# Patient Record
Sex: Female | Born: 1940 | Race: White | Hispanic: No | Smoking: Never smoker
Health system: Southern US, Community
[De-identification: ages and names within clinical notes are randomized; demographics above are authoritative.]

## PROBLEM LIST (undated history)

## (undated) DIAGNOSIS — M5136 Other intervertebral disc degeneration, lumbar region: Secondary | ICD-10-CM

## (undated) DIAGNOSIS — M51369 Other intervertebral disc degeneration, lumbar region without mention of lumbar back pain or lower extremity pain: Secondary | ICD-10-CM

## (undated) HISTORY — DX: Other intervertebral disc degeneration, lumbar region without mention of lumbar back pain or lower extremity pain: M51.369

## (undated) HISTORY — DX: Other intervertebral disc degeneration, lumbar region: M51.36

---

## 1966-10-15 HISTORY — PX: APPENDECTOMY: SHX54

## 1977-10-15 HISTORY — PX: KNEE SURGERY: SHX244

## 2001-10-15 LAB — CONVERTED CEMR LAB: Pap Smear: NORMAL

## 2009-06-22 ENCOUNTER — Ambulatory Visit: Payer: Self-pay | Admitting: Family Medicine

## 2009-06-22 ENCOUNTER — Encounter: Payer: Self-pay | Admitting: Family Medicine

## 2009-06-24 LAB — CONVERTED CEMR LAB
BUN: 24 mg/dL — ABNORMAL HIGH (ref 6–23)
CO2: 20 meq/L (ref 19–32)
Calcium: 9.8 mg/dL (ref 8.4–10.5)
Chloride: 106 meq/L (ref 96–112)
Cholesterol: 238 mg/dL — ABNORMAL HIGH (ref 0–200)
Creatinine, Ser: 0.86 mg/dL (ref 0.40–1.20)
Glucose, Bld: 100 mg/dL — ABNORMAL HIGH (ref 70–99)
HDL: 87 mg/dL (ref >39)
Total Bilirubin: 0.6 mg/dL (ref 0.3–1.2)
Total CHOL/HDL Ratio: 2.7
Triglycerides: 90 mg/dL (ref <150)
VLDL: 18 mg/dL (ref 0–40)

## 2009-06-30 ENCOUNTER — Telehealth: Payer: Self-pay | Admitting: Family Medicine

## 2010-08-24 ENCOUNTER — Ambulatory Visit: Payer: Self-pay | Admitting: Family Medicine

## 2010-09-04 ENCOUNTER — Encounter: Payer: Self-pay | Admitting: Family Medicine

## 2010-09-05 ENCOUNTER — Encounter: Admission: RE | Admit: 2010-09-05 | Discharge: 2010-09-05 | Payer: Self-pay | Admitting: Family Medicine

## 2010-09-05 DIAGNOSIS — M810 Age-related osteoporosis without current pathological fracture: Secondary | ICD-10-CM

## 2010-09-05 LAB — CONVERTED CEMR LAB
Alkaline Phosphatase: 61 units/L (ref 39–117)
BUN: 17 mg/dL (ref 6–23)
CO2: 24 meq/L (ref 19–32)
Cholesterol: 229 mg/dL — ABNORMAL HIGH (ref 0–200)
Glucose, Bld: 101 mg/dL — ABNORMAL HIGH (ref 70–99)
HDL: 90 mg/dL (ref >39)
LDL Cholesterol: 120 mg/dL — ABNORMAL HIGH (ref 0–99)
Sodium: 139 meq/L (ref 135–145)
Total Bilirubin: 0.7 mg/dL (ref 0.3–1.2)
Total Protein: 7.1 g/dL (ref 6.0–8.3)
Triglycerides: 97 mg/dL (ref <150)
VLDL: 19 mg/dL (ref 0–40)

## 2010-09-06 ENCOUNTER — Encounter: Payer: Self-pay | Admitting: Family Medicine

## 2010-09-06 LAB — CONVERTED CEMR LAB: OCCULT 3: NEGATIVE

## 2010-09-11 ENCOUNTER — Encounter: Payer: Self-pay | Admitting: Family Medicine

## 2010-09-12 ENCOUNTER — Telehealth: Payer: Self-pay | Admitting: Family Medicine

## 2010-09-12 LAB — CONVERTED CEMR LAB: Vit D, 25-Hydroxy: 32 ng/mL (ref 30–89)

## 2010-11-14 NOTE — Progress Notes (Signed)
Summary: osteoporosis  Phone Note Call from Patient   Caller: Patient Call For: Nani Gasser MD Reason for Call: Privacy/Consent Authorization Summary of Call: pt decided she doesnt want to take medications for her osteoporosis. Pt wants to know if it is ok if she increases her calcium intake instead Initial call taken by: Avon Gully CMA, Duncan Dull),  September 12, 2010 1:23 PM  Follow-up for Phone Call        Recommend total of 1200mg  of calcium a day total. It is up to her if doesn't want to take the bone medicaiton.  Follow-up by: Nani Gasser MD,  September 12, 2010 3:18 PM  Additional Follow-up for Phone Call Additional follow up Details #1::        pt notified

## 2010-11-14 NOTE — Miscellaneous (Signed)
Summary: Stool cards neg x 3  Clinical Lists Changes  Orders: Added new Service order of Hemoccult Cards -3 specimans (take home) (16109) - Signed Observations: Added new observation of HEMOCCULT 3: negative (09/06/2010 8:18) Added new observation of HEMOCCULT 2: negative (09/06/2010 8:18) Added new observation of HEMOCCULT 1: negative (09/06/2010 8:18)     Laboratory Results    Stool - Occult Blood Hemmoccult #1: negative Hemoccult #2: negative Hemoccult #3: negative

## 2010-11-14 NOTE — Assessment & Plan Note (Signed)
Summary: Annual Wellness Exam   Vital Signs:  Patient profile:   70 year old female Height:      65.5 inches Weight:      146 pounds BMI:     24.01 Temp:     97.4 degrees F oral Pulse rate:   66 / minute BP sitting:   142 / 78  (right arm) Cuff size:   regular  Vitals Entered By: Avon Gully CMA, Duncan Dull) (August 24, 2010 2:19 PM) CC: CPE per insurance  Vision Screening:Left eye w/o correction: 20 / 30 Right Eye w/o correction: 20 / 40 Both eyes w/o correction:  20/ 25        Vision Entered By: Avon Gully CMA, Duncan Dull) (August 24, 2010 2:19 PM)   Primary Care Provider:  Nani Gasser, MD  CC:  CPE per insurance.  History of Present Illness: See scanned document.   Current Medications (verified): 1)  None  Allergies (verified): No Known Drug Allergies  Comments:  Nurse/Medical Assistant: The patient's medications and allergies were reviewed with the patient and were updated in the Medication and Allergy Lists. Avon Gully CMA, Duncan Dull) (August 24, 2010 2:20 PM)  Past History:  Past Surgical History: Knee surgery, left  1980 hardware in place Broken leg 1979 Appendectomy 1968  Family History: Reviewed history from 06/22/2009 and no changes required. 2 sisters with thyroid d/o.   Social History: Reviewed history from 06/22/2009 and no changes required. REtired. Masters degree. Married to [place]. Lives with her husband.  Never Smoked Alcohol use-no Drug use-no Regular exercise-yes  Physical Exam  General:  Well-developed,well-nourished,in no acute distress; alert,appropriate and cooperative throughout examination Head:  Normocephalic and atraumatic without obvious abnormalities. No apparent alopecia or balding. Eyes:  No corneal or conjunctival inflammation noted. EOMI. Perrla. Ears:  External ear exam shows no significant lesions or deformities.  Otoscopic examination reveals clear canals, tympanic membranes are intact bilaterally  without bulging, retraction, inflammation or discharge. Hearing is grossly normal bilaterally. Nose:  External nasal examination shows no deformity or inflammation.  Mouth:  Oral mucosa and oropharynx without lesions or exudates.   Neck:  No deformities, masses, or tenderness noted. Chest Wall:  No deformities, masses, or tenderness noted. Breasts:  No mass, nodules, thickening, tenderness, bulging, retraction, inflamation, nipple discharge or skin changes noted.  Breast are very nodular.  Lungs:  Normal respiratory effort, chest expands symmetrically. Lungs are clear to auscultation, no crackles or wheezes. Heart:  Normal rate and regular rhythm. S1 and S2 normal without gallop, murmur, click, rub or other extra sounds. Abdomen:  Bowel sounds positive,abdomen soft and non-tender without masses, organomegaly or hernias noted. Msk:  No deformity or scoliosis noted of thoracic or lumbar spine.   Pulses:  R and L carotid,radial,dorsalis pedis and posterior tibial pulses are full and equal bilaterally Extremities:  No clubbing, cyanosis, edema, or deformity noted with normal full range of motion of all joints.   Neurologic:  No cranial nerve deficits noted. Station and gait are normal.DTRs are symmetrical throughout. Sensory, motor and coordinative functions appear intact. Skin:  no rashes.   Cervical Nodes:  No lymphadenopathy noted Axillary Nodes:  No palpable lymphadenopathy Psych:  Cognition and judgment appear intact. Alert and cooperative with normal attention span and concentration. No apparent delusions, illusions, hallucinations   Impression & Recommendations:  Problem # 1:  HEALTH MAINTENANCE EXAM (ICD-V70.0)  Encourage regular exercise and weight loss.   See scanned form.    Orders: [facility] -Subsequent Annual Wellness Visit [phone])  Complete Medication List: 1)  Calcium + D 600-200 Mg-unit Tabs (Calcium carbonate-vitamin d) .... Take 1 tablet by mouth two times a day 2)  Zostavax  16109 Unt/0.32ml Solr (Zoster vaccine live) .... One dose im x 1  Other Orders: T-Lipid Profile (60454-09811) T-Comprehensive Metabolic Panel [id number]) T-Dual DXA Bone Density/ Axial (13086) Flu Vaccine 41yrs + MEDICARE PATIENTS (V7846) Administration Flu vaccine - MCR (N6295) Prescriptions: ZOSTAVAX 19400 UNT/0.65ML SOLR (ZOSTER VACCINE LIVE) One dose IM x 1  #1 x 0   Entered and Authorized by:   Nani Gasser MD   Signed by:   Nani Gasser MD on 08/24/2010   Method used:   Print then Give to Patient   RxID:   [id number]    Orders Added: 1)  T-Lipid Profile [66440-34742] 2)  T-Comprehensive Metabolic Panel [59563-87564] 3)  T-Dual DXA Bone Density/ Axial [77080] 4)  [facility] -Subsequent Annual Wellness Visit [G0439] 5)  Flu Vaccine 76yrs + MEDICARE PATIENTS [Q2039] 6)  Administration Flu vaccine - MCR [G0008]   Contraindications/Deferment of Procedures/Staging:    Test/Procedure: Colonoscopy    Reason for deferment: patient declined   Flu Vaccine Consent Questions     Do you have a history of severe allergic reactions to this vaccine? no    Any prior history of allergic reactions to egg and/or gelatin? no    Do you have a sensitivity to the preservative Thimersol? no    Do you have a past history of Guillan-Barre Syndrome? no    Do you currently have an acute febrile illness? no    Have you ever had a severe reaction to latex? no    Vaccine information given and explained to patient? yes    Are you currently pregnant? no    Lot Number:[id number]   Exp Date:04/14/2011   Site Given  Left Deltoid IM Mammogram Result Date:  07/15/2009 Mammogram Result:  normal Mammogram Next Due:  2 yr  .lbmedflu

## 2010-11-14 NOTE — Letter (Signed)
Summary: Medicare Physical Exam Form  Medicare Physical Exam Form   Imported By: Lanelle Bal 09/05/2010 12:02:36  _____________________________________________________________________  External Attachment:    Type:   Image     Comment:   External Document

## 2012-09-15 ENCOUNTER — Encounter: Payer: Self-pay | Admitting: Family Medicine

## 2012-09-15 ENCOUNTER — Ambulatory Visit (INDEPENDENT_AMBULATORY_CARE_PROVIDER_SITE_OTHER): Payer: Medicare Other | Admitting: Family Medicine

## 2012-09-15 VITALS — BP 133/72 | HR 72 | Ht 64.0 in | Wt 148.0 lb

## 2012-09-15 DIAGNOSIS — N390 Urinary tract infection, site not specified: Secondary | ICD-10-CM

## 2012-09-15 LAB — POCT URINALYSIS DIPSTICK
Bilirubin, UA: NEGATIVE
Glucose, UA: NEGATIVE
Nitrite, UA: NEGATIVE
Spec Grav, UA: >=1.03
Urobilinogen, UA: 0.2

## 2012-09-15 MED ORDER — CIPROFLOXACIN HCL 500 MG PO TABS: 500.0000 mg | ORAL_TABLET | Freq: Two times a day (BID) | ORAL | Status: DC

## 2012-09-15 NOTE — Patient Instructions (Signed)
Urinary Tract Infection Urinary tract infections (UTIs) can develop anywhere along your urinary tract. Your urinary tract is your body's drainage system for removing wastes and extra water. Your urinary tract includes two kidneys, two ureters, a bladder, and a urethra. Your kidneys are a pair of bean-shaped organs. Each kidney is about the size of your fist. They are located below your ribs, one on each side of your spine. CAUSES Infections are caused by microbes, which are microscopic organisms, including fungi, viruses, and bacteria. These organisms are so small that they can only be seen through a microscope. Bacteria are the microbes that most commonly cause UTIs. SYMPTOMS  Symptoms of UTIs may vary by age and gender of the patient and by the location of the infection. Symptoms in young women typically include a frequent and intense urge to urinate and a painful, burning feeling in the bladder or urethra during urination. Older women and men are more likely to be tired, shaky, and weak and have muscle aches and abdominal pain. A fever may mean the infection is in your kidneys. Other symptoms of a kidney infection include pain in your back or sides below the ribs, nausea, and vomiting. DIAGNOSIS To diagnose a UTI, your caregiver will ask you about your symptoms. Your caregiver also will ask to provide a urine sample. The urine sample will be tested for bacteria and white blood cells. White blood cells are made by your body to help fight infection. TREATMENT  Typically, UTIs can be treated with medication. Because most UTIs are caused by a bacterial infection, they usually can be treated with the use of antibiotics. The choice of antibiotic and length of treatment depend on your symptoms and the type of bacteria causing your infection. HOME CARE INSTRUCTIONS  If you were prescribed antibiotics, take them exactly as your caregiver instructs you. Finish the medication even if you feel better after you  have only taken some of the medication.  Drink enough water and fluids to keep your urine clear or pale yellow.  Avoid caffeine, tea, and carbonated beverages. They tend to irritate your bladder.  Empty your bladder often. Avoid holding urine for long periods of time.  Empty your bladder before and after sexual intercourse.  After a bowel movement, women should cleanse from front to back. Use each tissue only once. SEEK MEDICAL CARE IF:   You have back pain.  You develop a fever.  Your symptoms do not begin to resolve within 3 days. SEEK IMMEDIATE MEDICAL CARE IF:   You have severe back pain or lower abdominal pain.  You develop chills.  You have nausea or vomiting.  You have continued burning or discomfort with urination. MAKE SURE YOU:   Understand these instructions.  Will watch your condition.  Will get help right away if you are not doing well or get worse. Document Released: 07/11/2005 Document Revised: 04/01/2012 Document Reviewed: 11/09/2011 Exit[employer] Patient Information 2013 [employer].  

## 2012-09-15 NOTE — Progress Notes (Signed)
  Subjective:    Patient ID: Rhonda Scott, female    DOB: October 25, 1940, 71 y.o.   MRN: [id number]  HPI x 5 days, pressure, voiding small amts, feels the need to go but dribbling , no burning and no blood Some mild low back pain. No fever.    Review of Systems     Objective:   Physical Exam  Constitutional: She is oriented to person, place, and time. She appears well-developed and well-nourished.  HENT:  Head: Normocephalic and atraumatic.  Cardiovascular: Normal rate, regular rhythm and normal heart sounds.   Pulmonary/Chest: Effort normal and breath sounds normal.  Musculoskeletal:       No CVA tenderness  Neurological: She is alert and oriented to person, place, and time.  Skin: Skin is warm and dry.  Psychiatric: She has a normal mood and affect. Her behavior is normal.          Assessment & Plan:  UTI- will treat with Cipro 500 mg twice a day x3 days. Uncomplicated UTI. No culture sent. Call if not significantly better in 3 days. Increase water intake. Can use Azo-Standard over-the-counter as needed for discomfort.

## 2014-08-19 ENCOUNTER — Ambulatory Visit (INDEPENDENT_AMBULATORY_CARE_PROVIDER_SITE_OTHER): Payer: Medicare HMO

## 2014-08-19 ENCOUNTER — Ambulatory Visit (INDEPENDENT_AMBULATORY_CARE_PROVIDER_SITE_OTHER): Payer: Medicare Other | Admitting: Sports Medicine

## 2014-08-19 ENCOUNTER — Encounter: Payer: Self-pay | Admitting: Sports Medicine

## 2014-08-19 VITALS — BP 130/73 | HR 75 | Ht 64.0 in | Wt 147.0 lb

## 2014-08-19 DIAGNOSIS — M545 Low back pain, unspecified: Secondary | ICD-10-CM

## 2014-08-19 DIAGNOSIS — M5136 Other intervertebral disc degeneration, lumbar region: Secondary | ICD-10-CM | POA: Insufficient documentation

## 2014-08-19 DIAGNOSIS — M1288 Other specific arthropathies, not elsewhere classified, other specified site: Secondary | ICD-10-CM

## 2014-08-19 DIAGNOSIS — M47812 Spondylosis without myelopathy or radiculopathy, cervical region: Secondary | ICD-10-CM

## 2014-08-19 DIAGNOSIS — M47897 Other spondylosis, lumbosacral region: Secondary | ICD-10-CM

## 2014-08-19 DIAGNOSIS — M51369 Other intervertebral disc degeneration, lumbar region without mention of lumbar back pain or lower extremity pain: Secondary | ICD-10-CM | POA: Insufficient documentation

## 2014-08-19 MED ORDER — MELOXICAM 15 MG PO TABS: ORAL_TABLET | ORAL | Status: DC

## 2014-08-19 MED ORDER — PREDNISONE 50 MG PO TABS: ORAL_TABLET | ORAL | Status: DC

## 2014-08-19 NOTE — Assessment & Plan Note (Signed)
Prednisone, mobile, formal PT, x-rays. Return in one month, MRIif no better. 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

## 2014-08-19 NOTE — Progress Notes (Signed)
   Subjective:    I'm seeing this patient as a consultation for:  Dr. Madilyn Fireman  CC: right shoulder pain  HPI: This is a very pleasant 73 year old female, for sometime now she's had pain that she localizes in her Right trapezius, with radiation in the periscapular region, it does not go down her arm, and is fairly localized, it is worsened with ipsilateral rotation of the neck and extension. Symptoms are moderate, persistent. No lower extremity symptoms.  On further questioning she also has back pain, moderate, persistent and localized in the right paralumbar muscles.  Past medical history, Surgical history, Family history not pertinant except as noted below, Social history, Allergies, and medications have been entered into the medical record, reviewed, and no changes needed.   Review of Systems: No headache, visual changes, nausea, vomiting, diarrhea, constipation, dizziness, abdominal pain, skin rash, fevers, chills, night sweats, weight loss, swollen lymph nodes, body aches, joint swelling, muscle aches, chest pain, shortness of breath, mood changes, visual or auditory hallucinations.   Objective:   General: Well Developed, well nourished, and in no acute distress.  Neuro/Psych: Alert and oriented x3, extra-ocular muscles intact, able to move all 4 extremities, sensation grossly intact. Skin: Warm and dry, no rashes noted.  Respiratory: Not using accessory muscles, speaking in full sentences, trachea midline.  Cardiovascular: Pulses palpable, no extremity edema. Abdomen: Does not appear distended. Neck: Negative spurling's, right-sided rotation and extension does however reproduced axial pain suggestive of facet arthropathy. 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

## 2014-08-19 NOTE — Assessment & Plan Note (Signed)
X-rays, PT.  

## 2014-08-24 ENCOUNTER — Ambulatory Visit (INDEPENDENT_AMBULATORY_CARE_PROVIDER_SITE_OTHER): Payer: Commercial Managed Care - HMO | Admitting: Physical Therapy

## 2014-08-24 ENCOUNTER — Telehealth: Payer: Self-pay | Admitting: Sports Medicine

## 2014-08-24 DIAGNOSIS — R293 Abnormal posture: Secondary | ICD-10-CM

## 2014-08-24 DIAGNOSIS — M47812 Spondylosis without myelopathy or radiculopathy, cervical region: Secondary | ICD-10-CM | POA: Diagnosis not present

## 2014-08-24 DIAGNOSIS — M2550 Pain in unspecified joint: Secondary | ICD-10-CM | POA: Diagnosis not present

## 2014-08-24 NOTE — Telephone Encounter (Signed)
Patient said mobic is not working for her.  Stomach pains are terrible and wondered if you could call her in something else that is not as harsh.  She uses [facility].  Thanks!

## 2014-08-24 NOTE — Telephone Encounter (Signed)
Patient advised and samples have been left up front.   Vimovo 500/20 mg # 18 tablets NDC 18841-660-63 lot [id number] exp 5/20017

## 2014-08-24 NOTE — Telephone Encounter (Signed)
Sounds more like the prednisone, stop Mobic for now, and come pick up Vimovo samples. (naproxen + esomeprazole)  Lets give her 2 weeks of pills (BID)

## 2014-08-31 ENCOUNTER — Encounter (INDEPENDENT_AMBULATORY_CARE_PROVIDER_SITE_OTHER): Payer: Medicare HMO | Admitting: Physical Therapy

## 2014-08-31 DIAGNOSIS — M47812 Spondylosis without myelopathy or radiculopathy, cervical region: Secondary | ICD-10-CM

## 2014-08-31 DIAGNOSIS — M2550 Pain in unspecified joint: Secondary | ICD-10-CM

## 2014-08-31 DIAGNOSIS — R293 Abnormal posture: Secondary | ICD-10-CM

## 2014-09-02 ENCOUNTER — Encounter (INDEPENDENT_AMBULATORY_CARE_PROVIDER_SITE_OTHER): Payer: Medicare HMO | Admitting: Physical Therapy

## 2014-09-02 DIAGNOSIS — M47812 Spondylosis without myelopathy or radiculopathy, cervical region: Secondary | ICD-10-CM

## 2014-09-02 DIAGNOSIS — R293 Abnormal posture: Secondary | ICD-10-CM

## 2014-09-02 DIAGNOSIS — M2550 Pain in unspecified joint: Secondary | ICD-10-CM

## 2014-09-07 ENCOUNTER — Encounter (INDEPENDENT_AMBULATORY_CARE_PROVIDER_SITE_OTHER): Payer: Medicare HMO | Admitting: Physical Therapy

## 2014-09-07 DIAGNOSIS — M2550 Pain in unspecified joint: Secondary | ICD-10-CM

## 2014-09-07 DIAGNOSIS — M47812 Spondylosis without myelopathy or radiculopathy, cervical region: Secondary | ICD-10-CM

## 2014-09-07 DIAGNOSIS — R293 Abnormal posture: Secondary | ICD-10-CM

## 2014-09-23 ENCOUNTER — Ambulatory Visit (INDEPENDENT_AMBULATORY_CARE_PROVIDER_SITE_OTHER): Payer: Medicare HMO | Admitting: Sports Medicine

## 2014-09-23 ENCOUNTER — Encounter: Payer: Self-pay | Admitting: Sports Medicine

## 2014-09-23 VITALS — BP 128/67 | HR 65 | Ht 64.0 in | Wt 148.0 lb

## 2014-09-23 DIAGNOSIS — M47812 Spondylosis without myelopathy or radiculopathy, cervical region: Secondary | ICD-10-CM

## 2014-09-23 DIAGNOSIS — M545 Low back pain, unspecified: Secondary | ICD-10-CM

## 2014-09-23 MED ORDER — TRAMADOL HCL 50 MG PO TABS: ORAL_TABLET | ORAL | Status: DC

## 2014-09-23 NOTE — Assessment & Plan Note (Signed)
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 Improved with physical therapy but again, still painful at night.  tramadol as above. If persistent pain that is intolerable we will proceed with MRI for intervention.

## 2014-09-23 NOTE — Progress Notes (Signed)
  Subjective:    CC: Follow-up  HPI: Neck pain: Clinically represents a cervical facet syndrome, improved significantly with formal physical therapy. The minimal to trying another medication before proceeding with intervention.  Low back pain: With degenerative disc disease, again would like to proceed with a different medication before considering MRI for interventional treatment.  Past medical history, Surgical history, Family history not pertinant except as noted below, Social history, Allergies, and medications have been entered into the medical record, reviewed, and no changes needed.   Review of Systems: No fevers, chills, night sweats, weight loss, chest pain, or shortness of breath.   Objective:    General: Well Developed, well nourished, and in no acute distress.  Neuro: Alert and oriented x3, extra-ocular muscles intact, sensation grossly intact.  HEENT: Normocephalic, atraumatic, pupils equal round reactive to light, neck supple, no masses, no lymphadenopathy, thyroid nonpalpable.  Skin: Warm and dry, no rashes. Cardiac: Regular rate and rhythm, no murmurs rubs or gallops, no lower extremity edema.  Respiratory: Clear to auscultation bilaterally. Not using accessory muscles, speaking in full sentences.  Impression and Recommendations:

## 2014-09-23 NOTE — Assessment & Plan Note (Signed)
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 This goes along with her story of right-sided axial pain without radiculopathy. Suggestive of a facet syndrome. She improved with physical therapy, would like to try medication before proceeding with MRI for interventional.  Starting tramadol.

## 2014-10-26 ENCOUNTER — Ambulatory Visit: Payer: Medicare HMO | Admitting: Sports Medicine

## 2017-03-28 ENCOUNTER — Ambulatory Visit (INDEPENDENT_AMBULATORY_CARE_PROVIDER_SITE_OTHER): Payer: Medicare HMO | Admitting: Family Medicine

## 2017-03-28 ENCOUNTER — Encounter: Payer: Self-pay | Admitting: Family Medicine

## 2017-03-28 ENCOUNTER — Ambulatory Visit (INDEPENDENT_AMBULATORY_CARE_PROVIDER_SITE_OTHER): Payer: Medicare HMO

## 2017-03-28 VITALS — BP 132/54 | HR 57 | Ht 63.03 in | Wt 143.0 lb

## 2017-03-28 DIAGNOSIS — M545 Low back pain, unspecified: Secondary | ICD-10-CM

## 2017-03-28 DIAGNOSIS — G8929 Other chronic pain: Secondary | ICD-10-CM | POA: Diagnosis not present

## 2017-03-28 DIAGNOSIS — R319 Hematuria, unspecified: Secondary | ICD-10-CM

## 2017-03-28 DIAGNOSIS — R3915 Urgency of urination: Secondary | ICD-10-CM | POA: Diagnosis not present

## 2017-03-28 DIAGNOSIS — M5137 Other intervertebral disc degeneration, lumbosacral region: Secondary | ICD-10-CM | POA: Diagnosis not present

## 2017-03-28 LAB — POCT URINALYSIS DIPSTICK
Bilirubin, UA: NEGATIVE
Glucose, UA: NEGATIVE
KETONES UA: NEGATIVE
Nitrite, UA: NEGATIVE
PH UA: 5 (ref 5.0–8.0)
PROTEIN UA: NEGATIVE
SPEC GRAV UA: 1.025 (ref 1.010–1.025)
Urobilinogen, UA: 0.2 E.U./dL

## 2017-03-28 MED ORDER — CELECOXIB 100 MG PO CAPS: 100.0000 mg | ORAL_CAPSULE | Freq: Two times a day (BID) | ORAL | 5 refills | Status: DC | PRN

## 2017-03-28 NOTE — Progress Notes (Signed)
Subjective:    Patient ID: Rhonda Scott, female    DOB: 1940-12-29, 76 y.o.   MRN: [id number]  HPI  76 year old female is here to reestablish care. She was last seen by me approximately 7 years ago.  Reports Low back Pain x 3 years. Worse at night. Pain 8/10. Doesn't take medication for it actively but has in the past. She describes her pain as a dull ache.  She was evaluated by sports medicine back in 2015 for right-sided low back pain. X-ray lumbar spine at that time showed moderate degenerative disc disease at L4-5, L5-S1 as well as some osteopenia. She said her pain has been gradually getting worse over the last year. She did do physical therapy 3 years ago and says it really wasn't helpful at all.  Urinary urgenecy - mostly notices it when she's at home. If she doesn't make it to the bathroom in time then she will become incontinent. She denies any fevers, chills or pain. She denies any blood in the urine. No change in odor to the urine.   Review of Systems  Constitutional: Negative for diaphoresis, fever and unexpected weight change.  HENT: Negative for hearing loss, rhinorrhea and tinnitus.   Eyes: Negative for visual disturbance.  Respiratory: Negative for cough and wheezing.   Cardiovascular: Negative for chest pain and palpitations.  Gastrointestinal: Negative for blood in stool, diarrhea, nausea and vomiting.  Genitourinary: Negative for difficulty urinating, vaginal bleeding and vaginal discharge.       + leaking urine  Musculoskeletal: Positive for back pain. Negative for arthralgias and myalgias.  Skin: Negative for rash.  Neurological: Negative for headaches.  Hematological: Negative for adenopathy. Does not bruise/bleed easily.  Psychiatric/Behavioral: Negative for dysphoric mood and sleep disturbance. The patient is not nervous/anxious.       BP (!) 132/54   Pulse (!) 57   Ht 5' 3.03" (1.601 m)   Wt 143 lb (64.9 kg)   SpO2 99%   BMI 25.31 kg/m     No Known  Allergies  No past medical history on file.  Past Surgical History:  Procedure Laterality Date  . APPENDECTOMY  1968  . KNEE SURGERY  1979    Social History   Social History  . Marital status: Married    Spouse name: N/A  . Number of children: N/A  . Years of education: N/A   Occupational History  . Not on file.   Social History Main Topics  . Smoking status: Never Smoker  . Smokeless tobacco: Never Used  . Alcohol use No  . Drug use: No  . Sexual activity: Not on file   Other Topics Concern  . Not on file   Social History Narrative  . No narrative on file    No family history on file.  Outpatient Encounter Prescriptions as of 03/28/2017  Medication Sig  . celecoxib (CELEBREX) 100 MG capsule Take 1 capsule (100 mg total) by mouth 2 (two) times daily as needed.  . [DISCONTINUED] meloxicam (MOBIC) 15 MG tablet One tab PO qAM with breakfast for 2 weeks, then daily prn pain.  . [DISCONTINUED] traMADol (ULTRAM) 50 MG tablet 1-2 tabs by mouth Q8 hours, maximum 6 tabs per day.   No facility-administered encounter medications on file as of 03/28/2017.           Objective:   Physical Exam  Constitutional: She is oriented to person, place, and time. She appears well-developed and well-nourished.  HENT:  Head: Normocephalic and atraumatic.  Right Ear: External ear normal.  Left Ear: External ear normal.  Nose: Nose normal.  Cardiovascular: Normal rate, regular rhythm and normal heart sounds.   Pulmonary/Chest: Effort normal and breath sounds normal.  Abdominal: Soft. Bowel sounds are normal. She exhibits no distension and no mass. There is no tenderness. There is no rebound and no guarding.  Musculoskeletal:  Musculoskeletal-nontender over the lumbar spine or SI joints or paraspinous muscles. Negative straight leg raise let bilaterally. Hip, knee, ankle strength is 5 out of 5 bilaterally. Patellar reflexes 2+ bilaterally  Neurological: She is alert and oriented to  person, place, and time.  Skin: Skin is warm and dry.  Psychiatric: She has a normal mood and affect. Her behavior is normal.        Assessment & Plan:  Low back pain, right-sided, without radiculopathy-we'll get a repeat x-ray now but it's been 3 years. She was previously on meloxicam and tramadol. Will start Celebrex today. New prescription sent to the pharmacy. Will call with results once available. It might be best if she works on getting back in with sports medicine for further treatment options and she is Artie tried PT in the past and it was not helpful.  Urinary urgency-will rule out urinary tract infection first but suspect she is probably just having an overactive bladder. We discussed medications as an option.  Discussed need for colon cancer screening-discussed Colocort as an option.  She does not want to have any further mammograms. Did discuss current guidelines.

## 2017-03-29 LAB — URINALYSIS, MICROSCOPIC ONLY
BACTERIA UA: NONE SEEN [HPF]
Casts: NONE SEEN [LPF]
YEAST: NONE SEEN [HPF]

## 2017-04-02 ENCOUNTER — Encounter: Payer: Self-pay | Admitting: Sports Medicine

## 2017-04-02 ENCOUNTER — Ambulatory Visit (INDEPENDENT_AMBULATORY_CARE_PROVIDER_SITE_OTHER): Payer: Medicare HMO | Admitting: Sports Medicine

## 2017-04-02 ENCOUNTER — Ambulatory Visit (INDEPENDENT_AMBULATORY_CARE_PROVIDER_SITE_OTHER): Payer: Medicare HMO

## 2017-04-02 DIAGNOSIS — N390 Urinary tract infection, site not specified: Secondary | ICD-10-CM

## 2017-04-02 DIAGNOSIS — M545 Low back pain, unspecified: Secondary | ICD-10-CM

## 2017-04-02 DIAGNOSIS — R8299 Other abnormal findings in urine: Secondary | ICD-10-CM | POA: Diagnosis not present

## 2017-04-02 DIAGNOSIS — R109 Unspecified abdominal pain: Secondary | ICD-10-CM

## 2017-04-02 DIAGNOSIS — R319 Hematuria, unspecified: Secondary | ICD-10-CM | POA: Diagnosis not present

## 2017-04-02 LAB — POCT URINALYSIS DIPSTICK
Bilirubin, UA: NEGATIVE
Glucose, UA: NEGATIVE
Ketones, UA: NEGATIVE
Nitrite, UA: NEGATIVE
Protein, UA: NEGATIVE
Spec Grav, UA: 1.025 (ref 1.010–1.025)
Urobilinogen, UA: 0.2 U/dL
pH, UA: 5 (ref 5.0–8.0)

## 2017-04-02 MED ORDER — NITROFURANTOIN MONOHYD MACRO 100 MG PO CAPS: 100.0000 mg | ORAL_CAPSULE | Freq: Two times a day (BID) | ORAL | 0 refills | Status: DC

## 2017-04-02 MED ORDER — PHENAZOPYRIDINE HCL 200 MG PO TABS: 200.0000 mg | ORAL_TABLET | Freq: Three times a day (TID) | ORAL | 0 refills | Status: AC

## 2017-04-02 NOTE — Progress Notes (Signed)
   Subjective:    I'm seeing this patient as a consultation for:  Dr. Beatrice Lecher  CC: right flank and back pain  HPI: For the past several weeks this pleasant 76 year old female has had pain that she localizes in her right flank, for the most part is non-positional, she does have known lumbar degenerative disc disease that I treated 3 years ago with physical therapy, she responded favorably. She did have a urinalysis done several days ago that showed pyuria and hematuria. She is here for further evaluation of her back pain, has not had antibiotics or urine culture. Persistent right flank pain, no radiation, not positional, no constitutional symptoms, no dysuria.  Past medical history:  Negative.  See flowsheet/record as well for more information.  Surgical history: Negative.  See flowsheet/record as well for more information.  Family history: Negative.  See flowsheet/record as well for more information.  Social history: Negative.  See flowsheet/record as well for more information.  Allergies, and medications have been entered into the medical record, reviewed, and no changes needed.   Review of Systems: No headache, visual changes, nausea, vomiting, diarrhea, constipation, dizziness, abdominal pain, skin rash, fevers, chills, night sweats, weight loss, swollen lymph nodes, body aches, joint swelling, muscle aches, chest pain, shortness of breath, mood changes, visual or auditory hallucinations.   Objective:   General: Well Developed, well nourished, and in no acute distress.  Neuro/Psych: Alert and oriented x3, extra-ocular muscles intact, able to move all 4 extremities, sensation grossly intact. Skin: Warm and dry, no rashes noted.  Respiratory: Not using accessory muscles, speaking in full sentences, trachea midline.  Cardiovascular: Pulses palpable, no extremity edema. Abdomen: soft, nontender, nondistended, no bowel sounds, no palpable masses, no guarding or rigidity, she does  have moderate right costovertebral angle pain.  Urinalysis continues to be positive for blood and leukocytes. Culture sent off.  Abdominal x-ray personally reviewed, there are a few phleboliths, moderate stool burden, moderate degenerative changes in the lumbar spine but no evidence of nephrolithiasis.  Impression and Recommendations:   This case required medical decision making of moderate complexity.  Low back pain She does have degenerative disc disease at L4-L5 and L5-S1. 3 years ago this improved with physical therapy and occasional tramadol. She saw Dr. Madilyn Fireman a few days ago, a urinalysis was obtained that ultimately showed leukocytes and blood, no culture or antibiotics yet. We are obtaining a new urinalysis, urine culture, and I'm going to start antibiotics, her pain is in the right flank. Adding an abdominal x-ray looking for nephrolithiasis. If this does not relieve her back pain then we can definitively blame it on her spine, and proceed further in this direction.

## 2017-04-02 NOTE — Assessment & Plan Note (Signed)
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 3 years ago this improved with physical therapy and occasional tramadol. She saw Dr. Madilyn Fireman a few days ago, a urinalysis was obtained that ultimately showed leukocytes and blood, no culture or antibiotics yet. We are obtaining a new urinalysis, urine culture, and I'm going to start antibiotics, her pain is in the right flank. Adding an abdominal x-ray looking for nephrolithiasis. If this does not relieve her back pain then we can definitively blame it on her spine, and proceed further in this direction.

## 2017-04-04 LAB — URINE CULTURE

## 2017-04-16 ENCOUNTER — Encounter: Payer: Self-pay | Admitting: Sports Medicine

## 2017-04-16 ENCOUNTER — Ambulatory Visit (INDEPENDENT_AMBULATORY_CARE_PROVIDER_SITE_OTHER): Payer: Medicare HMO

## 2017-04-16 ENCOUNTER — Ambulatory Visit (INDEPENDENT_AMBULATORY_CARE_PROVIDER_SITE_OTHER): Payer: Medicare HMO | Admitting: Sports Medicine

## 2017-04-16 DIAGNOSIS — N12 Tubulo-interstitial nephritis, not specified as acute or chronic: Secondary | ICD-10-CM | POA: Diagnosis not present

## 2017-04-16 DIAGNOSIS — M5136 Other intervertebral disc degeneration, lumbar region: Secondary | ICD-10-CM | POA: Diagnosis not present

## 2017-04-16 DIAGNOSIS — M1732 Unilateral post-traumatic osteoarthritis, left knee: Secondary | ICD-10-CM

## 2017-04-16 DIAGNOSIS — M25561 Pain in right knee: Secondary | ICD-10-CM | POA: Diagnosis not present

## 2017-04-16 DIAGNOSIS — M25562 Pain in left knee: Secondary | ICD-10-CM | POA: Diagnosis not present

## 2017-04-16 DIAGNOSIS — M51369 Other intervertebral disc degeneration, lumbar region without mention of lumbar back pain or lower extremity pain: Secondary | ICD-10-CM

## 2017-04-16 LAB — POCT URINALYSIS DIPSTICK
Bilirubin, UA: NEGATIVE
Glucose, UA: NEGATIVE
Ketones, UA: NEGATIVE
Nitrite, UA: NEGATIVE
Protein, UA: NEGATIVE
Spec Grav, UA: 1.025 (ref 1.010–1.025)
Urobilinogen, UA: 0.2 U/dL
pH, UA: 5.5 (ref 5.0–8.0)

## 2017-04-16 MED ORDER — CIPROFLOXACIN HCL 750 MG PO TABS: 750.0000 mg | ORAL_TABLET | Freq: Two times a day (BID) | ORAL | 0 refills | Status: AC

## 2017-04-16 MED ORDER — PREDNISONE 50 MG PO TABS: ORAL_TABLET | ORAL | 0 refills | Status: DC

## 2017-04-16 NOTE — Addendum Note (Signed)
Addended by: Silverio Decamp on: 04/16/2017 08:44 AM   Modules accepted: Orders

## 2017-04-16 NOTE — Assessment & Plan Note (Addendum)
Initial urine culture did grow out 100,000 colony forming units of Klebsiella. We are going to recheck a urinalysis and urine culture to ensure full clearance of the urinary tract infection. Urinalysis still shows some blood and leukocytes, adding another culture, also switching to ciprofloxacin for an additional 10 days.

## 2017-04-16 NOTE — Assessment & Plan Note (Addendum)
Xrays, we may increase her celebrex to 400mg  at the next visit if insufficient improvement in her pain.

## 2017-04-16 NOTE — Addendum Note (Signed)
Addended by: Doree Albee on: 04/16/2017 08:46 AM   Modules accepted: Orders

## 2017-04-16 NOTE — Assessment & Plan Note (Addendum)
Nonspecific axial low back pain, prednisone, meloxicam, home rehabilitation exercises.  Return in one month, MRI for interventional planning if no better. She did have symptoms of pyelonephritis last time, these have improved slightly with antibiotics but not completely.

## 2017-04-16 NOTE — Progress Notes (Addendum)
  Subjective:    CC:  Follow-up  HPI: Low back pain: This is a pleasant 76 year old female, she had a Klebsiella pyelonephritis last time, we treated her aggressively with antibiotics and she returns today with persistent pain. It's in the same location, with similar features. She does have L4-L5 and L5-S1 degenerative disc disease, at the last visit the plan was to treat the UTI and then treat her as lumbar spondylosis if symptoms were persistent.  Past medical history:  Negative.  See flowsheet/record as well for more information.  Surgical history: Negative.  See flowsheet/record as well for more information.  Family history: Negative.  See flowsheet/record as well for more information.  Social history: Negative.  See flowsheet/record as well for more information.  Allergies, and medications have been entered into the medical record, reviewed, and no changes needed.   Review of Systems: No fevers, chills, night sweats, weight loss, chest pain, or shortness of breath.   Objective:    General: Well Developed, well nourished, and in no acute distress.  Neuro: Alert and oriented x3, extra-ocular muscles intact, sensation grossly intact.  HEENT: Normocephalic, atraumatic, pupils equal round reactive to light, neck supple, no masses, no lymphadenopathy, thyroid nonpalpable.  Skin: Warm and dry, no rashes. Cardiac: Regular rate and rhythm, no murmurs rubs or gallops, no lower extremity edema.  Respiratory: Clear to auscultation bilaterally. Not using accessory muscles, speaking in full sentences.  Urinalysis still shows blood and leukocytes. This will be cultured.  Impression and Recommendations:    Lumbar degenerative disc disease Nonspecific axial low back pain, prednisone, meloxicam, home rehabilitation exercises.  Return in one month, MRI for interventional planning if no better. She did have symptoms of pyelonephritis last time, these have improved slightly with antibiotics but not  completely.  Pyelonephritis Initial urine culture did grow out 100,000 colony forming units of Klebsiella. We are going to recheck a urinalysis and urine culture to ensure full clearance of the urinary tract infection. Urinalysis still shows some blood and leukocytes, adding another culture, also switching to ciprofloxacin for an additional 10 days.  Post-traumatic osteoarthritis of left knee Xrays, we may increase her celebrex to 400mg  at the next visit if insufficient improvement in her pain.

## 2017-04-19 LAB — URINE CULTURE

## 2017-05-21 ENCOUNTER — Encounter: Payer: Self-pay | Admitting: Sports Medicine

## 2017-05-21 ENCOUNTER — Ambulatory Visit (INDEPENDENT_AMBULATORY_CARE_PROVIDER_SITE_OTHER): Payer: Medicare HMO | Admitting: Sports Medicine

## 2017-05-21 DIAGNOSIS — M1732 Unilateral post-traumatic osteoarthritis, left knee: Secondary | ICD-10-CM | POA: Diagnosis not present

## 2017-05-21 DIAGNOSIS — M51369 Other intervertebral disc degeneration, lumbar region without mention of lumbar back pain or lower extremity pain: Secondary | ICD-10-CM

## 2017-05-21 DIAGNOSIS — N12 Tubulo-interstitial nephritis, not specified as acute or chronic: Secondary | ICD-10-CM

## 2017-05-21 DIAGNOSIS — M5136 Other intervertebral disc degeneration, lumbar region: Secondary | ICD-10-CM | POA: Diagnosis not present

## 2017-05-21 NOTE — Assessment & Plan Note (Signed)
After 2 courses of antibiotics the symptoms have essentially resolved.

## 2017-05-21 NOTE — Progress Notes (Signed)
  Subjective:    CC: Follow-up  HPI: Posttraumatic osteoarthritis of the left knee: Still hurting, agreeable to proceed with injection.  Low back pain: Axial, better with walking, but leaning forward, suspicious for lumbar spinal stenosis. She feels when she walks, her back pain goes away and would like me to be more aggressive with treating the knee for now.  Pyelonephritis: Symptoms resolved now after 2 courses of antibiotics.  Past medical history:  Negative.  See flowsheet/record as well for more information.  Surgical history: Negative.  See flowsheet/record as well for more information.  Family history: Negative.  See flowsheet/record as well for more information.  Social history: Negative.  See flowsheet/record as well for more information.  Allergies, and medications have been entered into the medical record, reviewed, and no changes needed.   Review of Systems: No fevers, chills, night sweats, weight loss, chest pain, or shortness of breath.   Objective:    General: Well Developed, well nourished, and in no acute distress.  Neuro: Alert and oriented x3, extra-ocular muscles intact, sensation grossly intact.  HEENT: Normocephalic, atraumatic, pupils equal round reactive to light, neck supple, no masses, no lymphadenopathy, thyroid nonpalpable.  Skin: Warm and dry, no rashes. Cardiac: Regular rate and rhythm, no murmurs rubs or gallops, no lower extremity edema.  Respiratory: Clear to auscultation bilaterally. Not using accessory muscles, speaking in full sentences.  Procedure: Real-time Ultrasound Guided Injection of left knee Device: GE Logiq E  Verbal informed consent obtained.  Time-out conducted.  Noted no overlying erythema, induration, or other signs of local infection.  Skin prepped in a sterile fashion.  Local anesthesia: Topical Ethyl chloride.  With sterile technique and under real time ultrasound guidance:  1 mL kenalog 40, 2 mL lidocaine, 2 mL bupivacaine  injected easily Completed without difficulty  Pain immediately resolved suggesting accurate placement of the medication.  Advised to call if fevers/chills, erythema, induration, drainage, or persistent bleeding.  Images permanently stored and available for review in the ultrasound unit.  Impression: Technically successful ultrasound guided injection.  Impression and Recommendations:    Post-traumatic osteoarthritis of left knee Guided injection as above, Celebrex is intolerable. Return in one month.   Lumbar degenerative disc disease Axial low back pain, slightly discogenic, with a central stenosis component, better with leaning forward slightly. Has failed greater than 6 weeks of physician directed conservative measures. Proceeding with MRI for interventional planning, ultimately walking improves her back pain, we injected her knee, and she feels like she can walk more so we will hold off on epidural injection versus facet injection until another month.  Pyelonephritis After 2 courses of antibiotics the symptoms have essentially resolved.

## 2017-05-21 NOTE — Assessment & Plan Note (Addendum)
Axial low back pain, slightly discogenic, with a central stenosis component, better with leaning forward slightly. Has failed greater than 6 weeks of physician directed conservative measures. Proceeding with MRI for interventional planning, ultimately walking improves her back pain, we injected her knee, and she feels like she can walk more so we will hold off on epidural injection versus facet injection until another month.

## 2017-05-21 NOTE — Assessment & Plan Note (Signed)
Guided injection as above, Celebrex is intolerable. Return in one month.

## 2017-05-23 ENCOUNTER — Telehealth: Payer: Self-pay

## 2017-05-23 NOTE — Telephone Encounter (Signed)
Authorization for MR LUMBAR SPINE WO CONTRAST approved from 05/22/2017-05/23/2017. Reference [id number].

## 2017-06-03 ENCOUNTER — Ambulatory Visit (INDEPENDENT_AMBULATORY_CARE_PROVIDER_SITE_OTHER): Payer: Medicare HMO

## 2017-06-03 DIAGNOSIS — M47816 Spondylosis without myelopathy or radiculopathy, lumbar region: Secondary | ICD-10-CM | POA: Diagnosis not present

## 2017-06-03 DIAGNOSIS — M5136 Other intervertebral disc degeneration, lumbar region: Secondary | ICD-10-CM

## 2017-06-03 IMAGING — MR MR LUMBAR SPINE W/O CM
4 of 5 series · 25 of 48 positions shown · non-contrast
Comparison: None.

CLINICAL DATA: Axial back pain.  Right gluteal pain.  No leg pain.

EXAM:
MRI LUMBAR SPINE WITHOUT CONTRAST
TECHNIQUE: Multiplanar, multisequence MR imaging of the lumbar spine was
performed. No intravenous contrast was administered.

[Series 2: T2 · sagittal · 4.0mm · 0.81mm/px · 6 of 15 slices shown (1 of 2)]
[im 1/15]
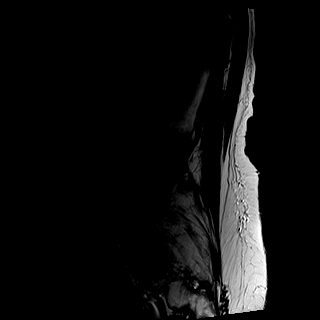
[im 3/15]
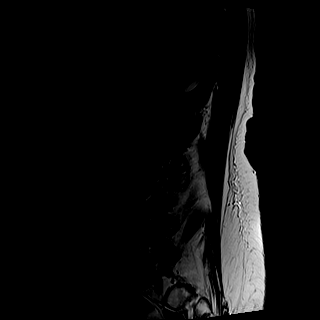
[im 6/15]
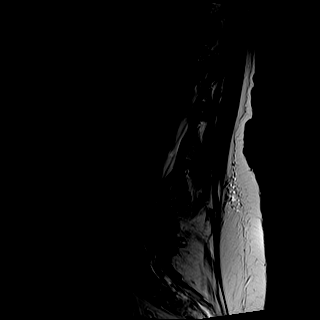
[im 9/15]
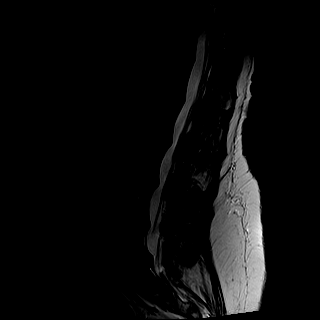
[im 12/15]
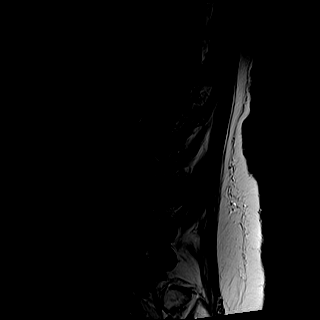
[im 15/15]
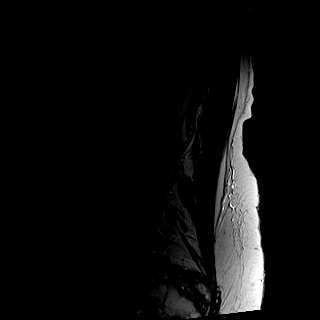

[Series 3: T1 · sagittal · 4.0mm · 0.41mm/px · 6 of 15 slices shown (1 of 2)]
[im 1/15]
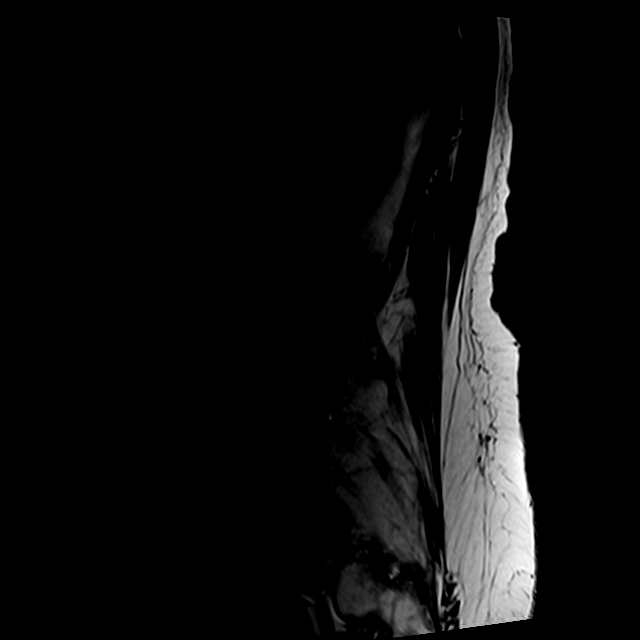
[im 3/15]
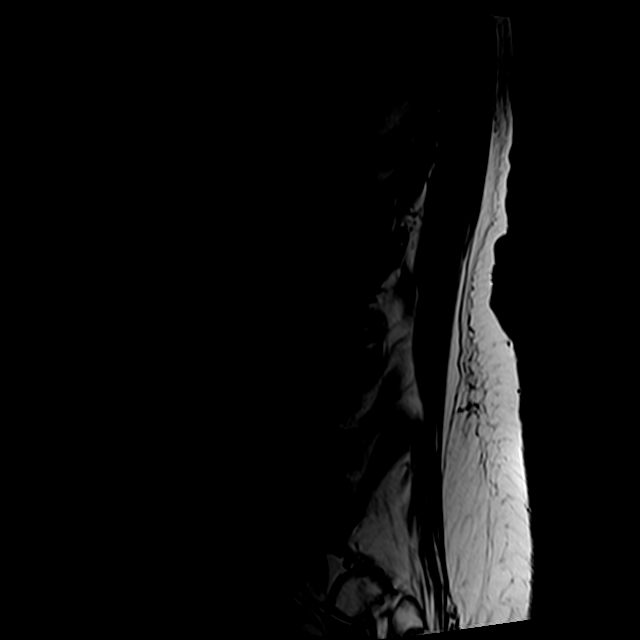
[im 6/15]
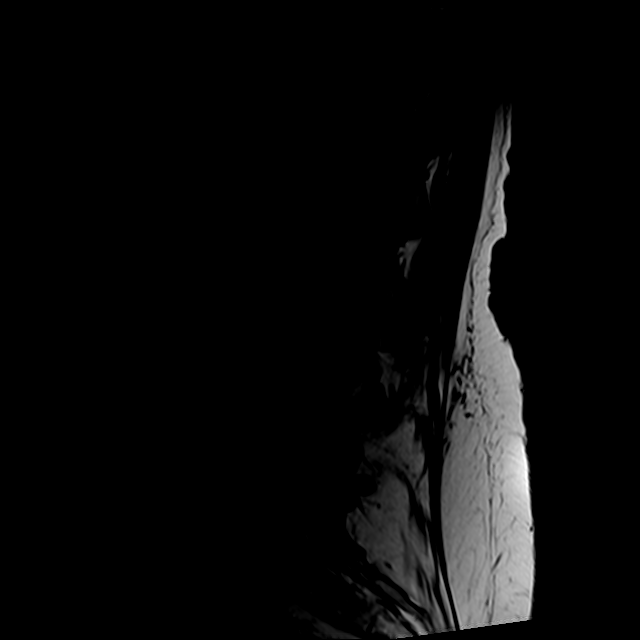
[im 9/15]
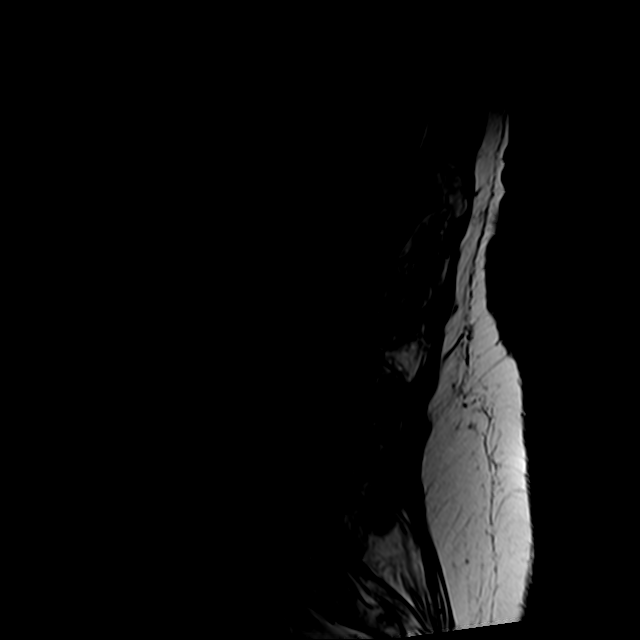
[im 12/15]
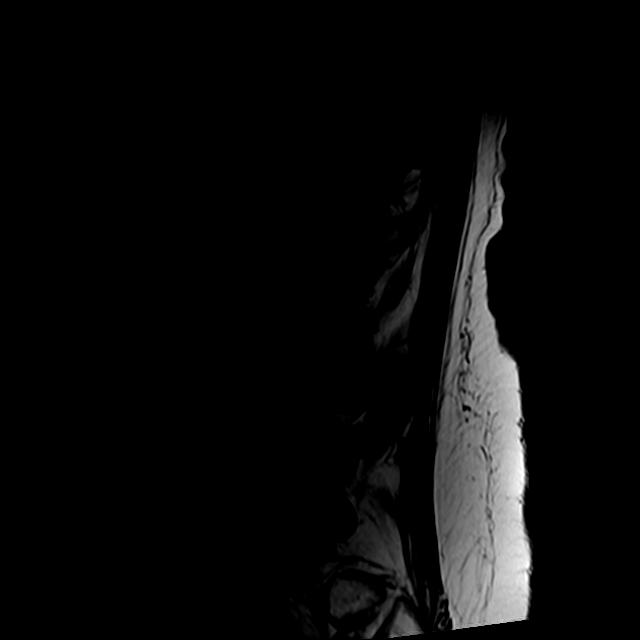
[im 15/15]
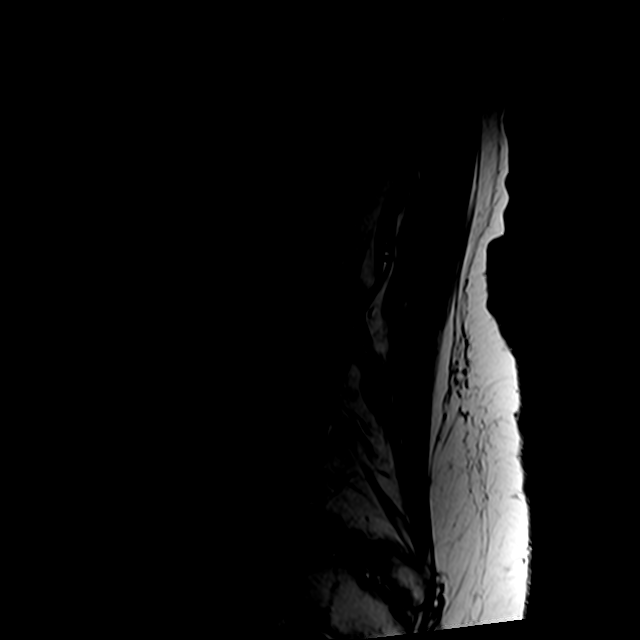

[Series 5: T2 · axial · 4.0mm · 0.78mm/px · z∈[-112,+87]mm · 9 of 36 slices shown (2 of 2)]
[im 1/36]
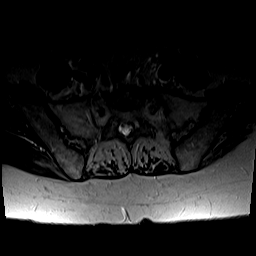
[im 6/36]
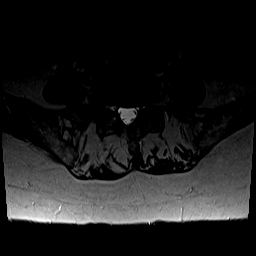
[im 11/36]
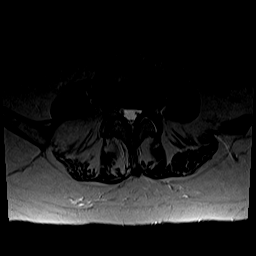
[im 16/36]
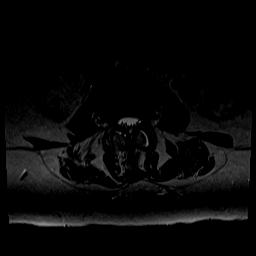
[im 18/36]
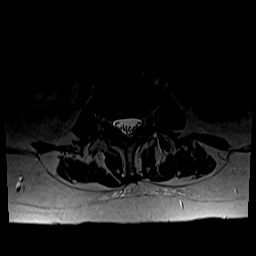
[im 21/36]
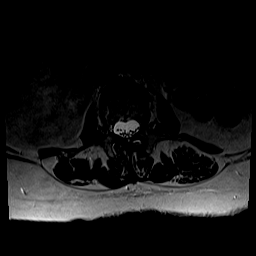
[im 26/36]
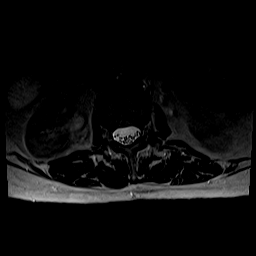
[im 31/36]
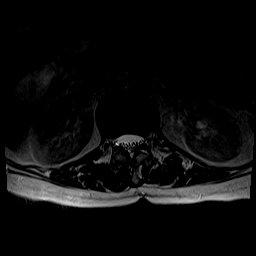
[im 36/36]
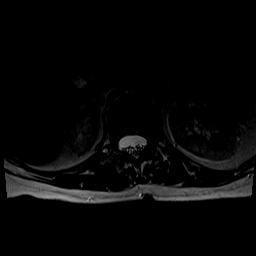

[Series 6: T1 · axial · 4.0mm · 0.39mm/px · z∈[-112,+63]mm · 4 of 36 slices shown (2 of 2)]
[im 1/36]
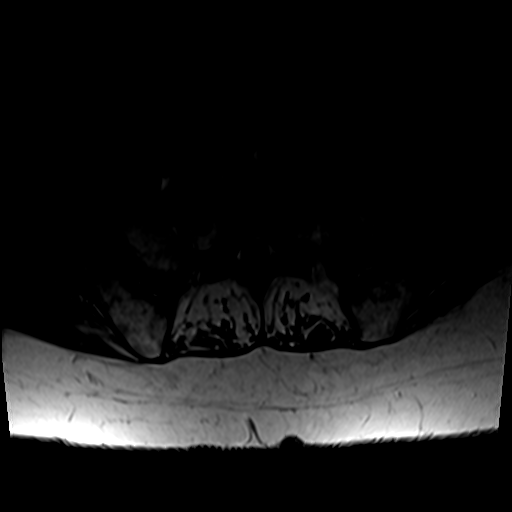
[im 6/36]
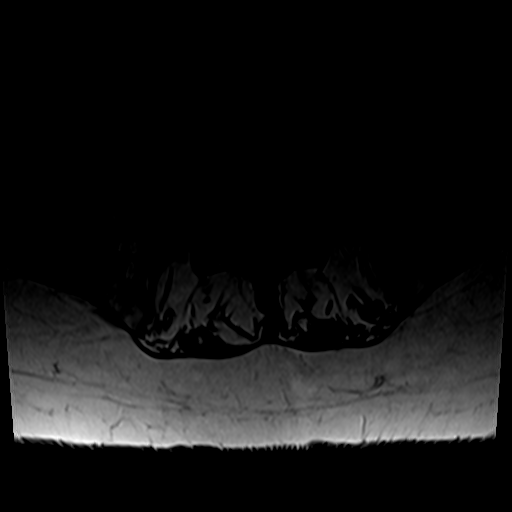
[im 18/36]
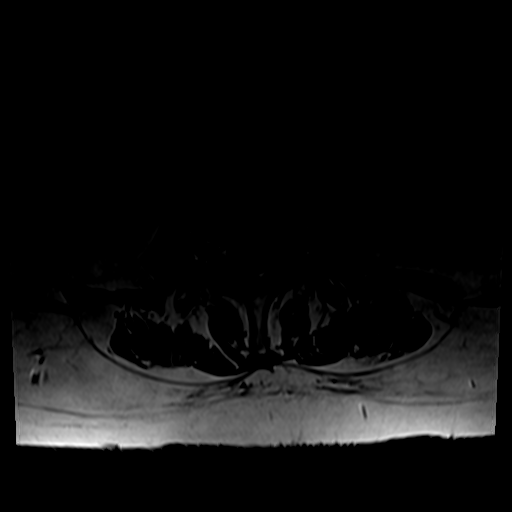
[im 31/36]
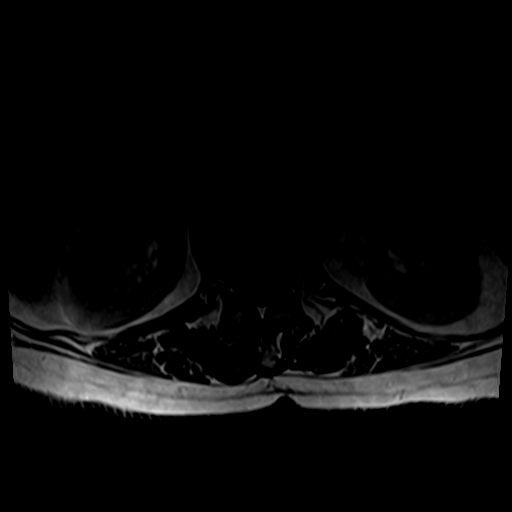

[25 of 48 positions shown; findings below may reference images not displayed]

FINDINGS: Segmentation:  Standard

Alignment: Mild lower lumbar levocurvature. On KUB [DATE] this
was seen with asymmetric right disc narrowing. Borderline
anterolisthesis at L1-2 and T12-L1.

Vertebrae: No fracture, evidence of discitis, or bone lesion.
Degenerative alteration of marrow signal about the right aspect of
the L4-5 and L5-S1 discs.

Conus medullaris: Extends to the T12-L1 level and appears normal.

Paraspinal and other soft tissues: Atrophy of lower lumbar intrinsic
back muscles.

Disc levels:

T11-12: Foraminal region 11 mm cystic structure on the right, likely
a synovial cyst. This impinges on the T11 nerve root by location.
This level is not covered on axial slices.

T12- L1: Slight anterolisthesis.  No herniation or impingement

L1-L2: Slight anterolisthesis.  No herniation or impingement

L2-L3:Mild disc narrowing and bulging. Mild facet spurring. Early
left subarticular recess narrowing, noncompressive

L3-L4: Mild disc narrowing and bulging. Facet arthropathy with
symmetric spurring and mild joint fluid. Subarticular recess
narrowing bilaterally without compression. Overall mild spinal
stenosis.

L4-L5:Advanced degenerative disc narrowing with bulging. On sagittal
acquisition there is appears to be a small down turning left
paracentral protrusion. Facet spurring greater on the right where
there is mild foraminal narrowing. Subarticular recess narrowing,
greater on the left where there is static impingement.

L5-S1:Moderate degenerative disc narrowing with circumferential
bulging. A posterior annular fissure is visible. The facets are
degenerated and there is a small posterior synovial cyst on the
right. Left more than right subarticular recess narrowing which
could affect either S1 nerve root. Mild foraminal narrowing.
IMPRESSION: 1. Diffuse lumbar facet arthropathy and lower lumbar disc
degeneration.Early scoliotic curvature with asymmetric L4-5 disc
narrowing.
2. T10-11 partially covered right foraminal region 11 mm cyst,
likely a synovial cyst with T11 impingement.
3. Bilateral subarticular recess narrowing from L3-4 to L5-S1.
Impingement is greatest on the left at L4-5 and bilaterally at
L5-S1.
4. No foraminal compressive stenosis. Mild right foraminal narrowing
at L4-5.
5. Prominent atrophy of lower lumbar intrinsic back muscles.

## 2017-06-06 ENCOUNTER — Ambulatory Visit (INDEPENDENT_AMBULATORY_CARE_PROVIDER_SITE_OTHER): Payer: Medicare HMO | Admitting: Sports Medicine

## 2017-06-06 ENCOUNTER — Encounter: Payer: Self-pay | Admitting: Sports Medicine

## 2017-06-06 DIAGNOSIS — M1732 Unilateral post-traumatic osteoarthritis, left knee: Secondary | ICD-10-CM

## 2017-06-06 DIAGNOSIS — M51369 Other intervertebral disc degeneration, lumbar region without mention of lumbar back pain or lower extremity pain: Secondary | ICD-10-CM

## 2017-06-06 DIAGNOSIS — M5136 Other intervertebral disc degeneration, lumbar region: Secondary | ICD-10-CM | POA: Diagnosis not present

## 2017-06-06 NOTE — Assessment & Plan Note (Signed)
Knee is now pain-free after injection at the last visit.

## 2017-06-06 NOTE — Progress Notes (Signed)
  Subjective:    CC: Follow-up  HPI: Left knee osteoarthritis: Completely resolved after injection at the last visit.  Low back pain: Right-sided, axial, worse with spinal extension. Nothing overtly radicular. Worse when laying flat in bed at night. She failed conservative measures so we obtained an MRI the results of which will be dictated below. Overall her pain is from a good, she tells me he did significant better when she's walking and injecting her knee has allowed this to happen. At this point she tells me is not hurting enough to consider any interventional treatment.  Past medical history:  Negative.  See flowsheet/record as well for more information.  Surgical history: Negative.  See flowsheet/record as well for more information.  Family history: Negative.  See flowsheet/record as well for more information.  Social history: Negative.  See flowsheet/record as well for more information.  Allergies, and medications have been entered into the medical record, reviewed, and no changes needed.   Review of Systems: No fevers, chills, night sweats, weight loss, chest pain, or shortness of breath.   Objective:    General: Well Developed, well nourished, and in no acute distress.  Neuro: Alert and oriented x3, extra-ocular muscles intact, sensation grossly intact.  HEENT: Normocephalic, atraumatic, pupils equal round reactive to light, neck supple, no masses, no lymphadenopathy, thyroid nonpalpable.  Skin: Warm and dry, no rashes. Cardiac: Regular rate and rhythm, no murmurs rubs or gallops, no lower extremity edema.  Respiratory: Clear to auscultation bilaterally. Not using accessory muscles, speaking in full sentences.  MRI personally reviewed, she has multilevel lower lumbar facet arthritis, mostly from L3-S1, as well as a few protruding discs and very mild spinal stenosis at the L4-L5 level.  Impression and Recommendations:    Post-traumatic osteoarthritis of left knee Knee is now  pain-free after injection at the last visit.  Lumbar degenerative disc disease Pain is axial, worse on the right side and worse with standing straight, better with leaning forward all consistent with facet mediated pain. MRI did show multilevel lower lumbar facet arthritis. Pain is not bad enough to consider shot but when the patient is ready we will do a right-sided L3-S1 facet joint injections. I'm happy to simply place the order if she gives me a phone call.  I would of course want to see her month after the shots. She only has some minimal disc protrusions, and minimal spinal stenosis at L4-L5.

## 2017-06-06 NOTE — Assessment & Plan Note (Signed)
Pain is axial, worse on the right side and worse with standing straight, better with leaning forward all consistent with facet mediated pain. MRI did show multilevel lower lumbar facet arthritis. Pain is not bad enough to consider shot but when the patient is ready we will do a right-sided L3-S1 facet joint injections. I'm happy to simply place the order if she gives me a phone call.  I would of course want to see her month after the shots. She only has some minimal disc protrusions, and minimal spinal stenosis at L4-L5.

## 2017-06-18 ENCOUNTER — Ambulatory Visit: Payer: Self-pay | Admitting: Sports Medicine

## 2018-05-22 ENCOUNTER — Telehealth: Payer: Self-pay | Admitting: Sports Medicine

## 2018-05-22 DIAGNOSIS — M51369 Other intervertebral disc degeneration, lumbar region without mention of lumbar back pain or lower extremity pain: Secondary | ICD-10-CM

## 2018-05-22 DIAGNOSIS — M5136 Other intervertebral disc degeneration, lumbar region: Secondary | ICD-10-CM

## 2018-05-22 NOTE — Telephone Encounter (Signed)
Patient is requesting that you order facet injections for her as was discussed in her last appointment. Please advise patient. Thanks!

## 2018-05-22 NOTE — Telephone Encounter (Signed)
[facility] notified.

## 2018-05-22 NOTE — Telephone Encounter (Signed)
Orders placed for right-sided L3-S1 facet joint injections.  Please contact [facility] for scheduling.

## 2018-06-06 DIAGNOSIS — M1712 Unilateral primary osteoarthritis, left knee: Secondary | ICD-10-CM | POA: Diagnosis not present

## 2018-06-06 DIAGNOSIS — R52 Pain, unspecified: Secondary | ICD-10-CM | POA: Diagnosis not present

## 2018-06-09 ENCOUNTER — Ambulatory Visit
Admission: RE | Admit: 2018-06-09 | Discharge: 2018-06-09 | Disposition: A | Discharge status: Home / Self Care | Payer: Medicare HMO | Source: Ambulatory Visit | Attending: Sports Medicine | Admitting: Sports Medicine

## 2018-06-09 ENCOUNTER — Other Ambulatory Visit: Payer: Self-pay | Admitting: Sports Medicine

## 2018-06-09 DIAGNOSIS — M5136 Other intervertebral disc degeneration, lumbar region: Secondary | ICD-10-CM

## 2018-06-09 DIAGNOSIS — M47817 Spondylosis without myelopathy or radiculopathy, lumbosacral region: Secondary | ICD-10-CM | POA: Diagnosis not present

## 2018-06-09 MED ORDER — IOPAMIDOL (ISOVUE-M 200) INJECTION 41%
1.0000 mL | Freq: Once | INTRAMUSCULAR | Status: AC
  Administered 2018-06-09: 1 mL via INTRA_ARTICULAR

## 2018-06-09 MED ORDER — METHYLPREDNISOLONE ACETATE 40 MG/ML INJ SUSP (RADIOLOG
120.0000 mg | Freq: Once | INTRAMUSCULAR | Status: AC
  Administered 2018-06-09: 120 mg via INTRA_ARTICULAR

## 2018-06-09 NOTE — Discharge Instructions (Signed)

## 2018-06-20 DIAGNOSIS — M1712 Unilateral primary osteoarthritis, left knee: Secondary | ICD-10-CM | POA: Diagnosis not present

## 2018-06-20 DIAGNOSIS — Z969 Presence of functional implant, unspecified: Secondary | ICD-10-CM | POA: Diagnosis not present

## 2018-06-26 DIAGNOSIS — Z969 Presence of functional implant, unspecified: Secondary | ICD-10-CM | POA: Insufficient documentation

## 2018-07-14 DIAGNOSIS — M21962 Unspecified acquired deformity of left lower leg: Secondary | ICD-10-CM | POA: Diagnosis not present

## 2018-07-14 DIAGNOSIS — Z969 Presence of functional implant, unspecified: Secondary | ICD-10-CM | POA: Diagnosis not present

## 2018-07-14 DIAGNOSIS — T8484XA Pain due to internal orthopedic prosthetic devices, implants and grafts, initial encounter: Secondary | ICD-10-CM | POA: Diagnosis not present

## 2018-07-14 DIAGNOSIS — G8918 Other acute postprocedural pain: Secondary | ICD-10-CM | POA: Diagnosis not present

## 2018-07-23 DIAGNOSIS — Z969 Presence of functional implant, unspecified: Secondary | ICD-10-CM | POA: Diagnosis not present

## 2018-07-23 DIAGNOSIS — Z452 Encounter for adjustment and management of vascular access device: Secondary | ICD-10-CM | POA: Diagnosis not present

## 2018-07-23 DIAGNOSIS — M00062 Staphylococcal arthritis, left knee: Secondary | ICD-10-CM | POA: Diagnosis not present

## 2018-07-24 DIAGNOSIS — A4189 Other specified sepsis: Secondary | ICD-10-CM | POA: Diagnosis not present

## 2018-07-25 DIAGNOSIS — Z452 Encounter for adjustment and management of vascular access device: Secondary | ICD-10-CM | POA: Diagnosis not present

## 2018-07-25 DIAGNOSIS — Z969 Presence of functional implant, unspecified: Secondary | ICD-10-CM | POA: Diagnosis not present

## 2018-07-26 DIAGNOSIS — Z452 Encounter for adjustment and management of vascular access device: Secondary | ICD-10-CM | POA: Diagnosis not present

## 2018-07-26 DIAGNOSIS — Z792 Long term (current) use of antibiotics: Secondary | ICD-10-CM | POA: Diagnosis not present

## 2018-07-26 DIAGNOSIS — B957 Other staphylococcus as the cause of diseases classified elsewhere: Secondary | ICD-10-CM | POA: Diagnosis not present

## 2018-07-26 DIAGNOSIS — M00862 Arthritis due to other bacteria, left knee: Secondary | ICD-10-CM | POA: Diagnosis not present

## 2018-07-26 DIAGNOSIS — Z5181 Encounter for therapeutic drug level monitoring: Secondary | ICD-10-CM | POA: Diagnosis not present

## 2018-07-29 DIAGNOSIS — B957 Other staphylococcus as the cause of diseases classified elsewhere: Secondary | ICD-10-CM | POA: Diagnosis not present

## 2018-07-29 DIAGNOSIS — Z5181 Encounter for therapeutic drug level monitoring: Secondary | ICD-10-CM | POA: Diagnosis not present

## 2018-07-29 DIAGNOSIS — R52 Pain, unspecified: Secondary | ICD-10-CM | POA: Diagnosis not present

## 2018-07-29 DIAGNOSIS — I509 Heart failure, unspecified: Secondary | ICD-10-CM | POA: Diagnosis not present

## 2018-07-29 DIAGNOSIS — Z792 Long term (current) use of antibiotics: Secondary | ICD-10-CM | POA: Diagnosis not present

## 2018-07-29 DIAGNOSIS — Z452 Encounter for adjustment and management of vascular access device: Secondary | ICD-10-CM | POA: Diagnosis not present

## 2018-07-29 DIAGNOSIS — M00862 Arthritis due to other bacteria, left knee: Secondary | ICD-10-CM | POA: Diagnosis not present

## 2018-07-31 DIAGNOSIS — B957 Other staphylococcus as the cause of diseases classified elsewhere: Secondary | ICD-10-CM | POA: Diagnosis not present

## 2018-07-31 DIAGNOSIS — Z5181 Encounter for therapeutic drug level monitoring: Secondary | ICD-10-CM | POA: Diagnosis not present

## 2018-07-31 DIAGNOSIS — Z452 Encounter for adjustment and management of vascular access device: Secondary | ICD-10-CM | POA: Diagnosis not present

## 2018-07-31 DIAGNOSIS — Z792 Long term (current) use of antibiotics: Secondary | ICD-10-CM | POA: Diagnosis not present

## 2018-07-31 DIAGNOSIS — A4189 Other specified sepsis: Secondary | ICD-10-CM | POA: Diagnosis not present

## 2018-07-31 DIAGNOSIS — M00862 Arthritis due to other bacteria, left knee: Secondary | ICD-10-CM | POA: Diagnosis not present

## 2018-08-01 DIAGNOSIS — A4189 Other specified sepsis: Secondary | ICD-10-CM | POA: Diagnosis not present

## 2018-08-04 DIAGNOSIS — B957 Other staphylococcus as the cause of diseases classified elsewhere: Secondary | ICD-10-CM | POA: Diagnosis not present

## 2018-08-04 DIAGNOSIS — Z452 Encounter for adjustment and management of vascular access device: Secondary | ICD-10-CM | POA: Diagnosis not present

## 2018-08-04 DIAGNOSIS — Z792 Long term (current) use of antibiotics: Secondary | ICD-10-CM | POA: Diagnosis not present

## 2018-08-04 DIAGNOSIS — M00862 Arthritis due to other bacteria, left knee: Secondary | ICD-10-CM | POA: Diagnosis not present

## 2018-08-04 DIAGNOSIS — Z5181 Encounter for therapeutic drug level monitoring: Secondary | ICD-10-CM | POA: Diagnosis not present

## 2018-08-07 DIAGNOSIS — Z792 Long term (current) use of antibiotics: Secondary | ICD-10-CM | POA: Diagnosis not present

## 2018-08-07 DIAGNOSIS — M00862 Arthritis due to other bacteria, left knee: Secondary | ICD-10-CM | POA: Diagnosis not present

## 2018-08-07 DIAGNOSIS — Z5181 Encounter for therapeutic drug level monitoring: Secondary | ICD-10-CM | POA: Diagnosis not present

## 2018-08-07 DIAGNOSIS — B957 Other staphylococcus as the cause of diseases classified elsewhere: Secondary | ICD-10-CM | POA: Diagnosis not present

## 2018-08-07 DIAGNOSIS — Z452 Encounter for adjustment and management of vascular access device: Secondary | ICD-10-CM | POA: Diagnosis not present

## 2018-08-08 DIAGNOSIS — A4189 Other specified sepsis: Secondary | ICD-10-CM | POA: Diagnosis not present

## 2018-08-11 DIAGNOSIS — Z792 Long term (current) use of antibiotics: Secondary | ICD-10-CM | POA: Diagnosis not present

## 2018-08-11 DIAGNOSIS — Z452 Encounter for adjustment and management of vascular access device: Secondary | ICD-10-CM | POA: Diagnosis not present

## 2018-08-11 DIAGNOSIS — Z5181 Encounter for therapeutic drug level monitoring: Secondary | ICD-10-CM | POA: Diagnosis not present

## 2018-08-11 DIAGNOSIS — M00862 Arthritis due to other bacteria, left knee: Secondary | ICD-10-CM | POA: Diagnosis not present

## 2018-08-11 DIAGNOSIS — B957 Other staphylococcus as the cause of diseases classified elsewhere: Secondary | ICD-10-CM | POA: Diagnosis not present

## 2018-08-14 DIAGNOSIS — Z792 Long term (current) use of antibiotics: Secondary | ICD-10-CM | POA: Diagnosis not present

## 2018-08-14 DIAGNOSIS — Z5181 Encounter for therapeutic drug level monitoring: Secondary | ICD-10-CM | POA: Diagnosis not present

## 2018-08-14 DIAGNOSIS — M00862 Arthritis due to other bacteria, left knee: Secondary | ICD-10-CM | POA: Diagnosis not present

## 2018-08-14 DIAGNOSIS — Z452 Encounter for adjustment and management of vascular access device: Secondary | ICD-10-CM | POA: Diagnosis not present

## 2018-08-14 DIAGNOSIS — B957 Other staphylococcus as the cause of diseases classified elsewhere: Secondary | ICD-10-CM | POA: Diagnosis not present

## 2018-08-16 DIAGNOSIS — A4189 Other specified sepsis: Secondary | ICD-10-CM | POA: Diagnosis not present

## 2018-08-18 DIAGNOSIS — Z452 Encounter for adjustment and management of vascular access device: Secondary | ICD-10-CM | POA: Diagnosis not present

## 2018-08-18 DIAGNOSIS — Z792 Long term (current) use of antibiotics: Secondary | ICD-10-CM | POA: Diagnosis not present

## 2018-08-18 DIAGNOSIS — B957 Other staphylococcus as the cause of diseases classified elsewhere: Secondary | ICD-10-CM | POA: Diagnosis not present

## 2018-08-18 DIAGNOSIS — Z5181 Encounter for therapeutic drug level monitoring: Secondary | ICD-10-CM | POA: Diagnosis not present

## 2018-08-18 DIAGNOSIS — M00862 Arthritis due to other bacteria, left knee: Secondary | ICD-10-CM | POA: Diagnosis not present

## 2018-08-19 DIAGNOSIS — Z452 Encounter for adjustment and management of vascular access device: Secondary | ICD-10-CM | POA: Diagnosis not present

## 2018-08-19 DIAGNOSIS — Z5181 Encounter for therapeutic drug level monitoring: Secondary | ICD-10-CM | POA: Diagnosis not present

## 2018-08-19 DIAGNOSIS — M00862 Arthritis due to other bacteria, left knee: Secondary | ICD-10-CM | POA: Diagnosis not present

## 2018-08-19 DIAGNOSIS — Z792 Long term (current) use of antibiotics: Secondary | ICD-10-CM | POA: Diagnosis not present

## 2018-08-19 DIAGNOSIS — B957 Other staphylococcus as the cause of diseases classified elsewhere: Secondary | ICD-10-CM | POA: Diagnosis not present

## 2018-08-20 DIAGNOSIS — M009 Pyogenic arthritis, unspecified: Secondary | ICD-10-CM | POA: Diagnosis not present

## 2018-08-20 DIAGNOSIS — Z969 Presence of functional implant, unspecified: Secondary | ICD-10-CM | POA: Diagnosis not present

## 2018-08-27 DIAGNOSIS — R110 Nausea: Secondary | ICD-10-CM | POA: Diagnosis not present

## 2018-08-27 DIAGNOSIS — M009 Pyogenic arthritis, unspecified: Secondary | ICD-10-CM | POA: Diagnosis not present

## 2018-08-27 DIAGNOSIS — Z969 Presence of functional implant, unspecified: Secondary | ICD-10-CM | POA: Diagnosis not present

## 2018-10-10 DIAGNOSIS — M1712 Unilateral primary osteoarthritis, left knee: Secondary | ICD-10-CM | POA: Diagnosis not present

## 2018-10-10 DIAGNOSIS — Z969 Presence of functional implant, unspecified: Secondary | ICD-10-CM | POA: Diagnosis not present

## 2018-10-27 ENCOUNTER — Encounter: Payer: Self-pay | Admitting: Physician Assistant

## 2018-10-27 ENCOUNTER — Ambulatory Visit (INDEPENDENT_AMBULATORY_CARE_PROVIDER_SITE_OTHER): Payer: Medicare HMO | Admitting: Physician Assistant

## 2018-10-27 VITALS — BP 136/68 | HR 76 | Temp 97.6°F | Ht 63.0 in | Wt 143.0 lb

## 2018-10-27 DIAGNOSIS — J019 Acute sinusitis, unspecified: Secondary | ICD-10-CM

## 2018-10-27 MED ORDER — AMOXICILLIN-POT CLAVULANATE 875-125 MG PO TABS: 1.0000 | ORAL_TABLET | Freq: Two times a day (BID) | ORAL | 0 refills | Status: DC

## 2018-10-27 NOTE — Progress Notes (Signed)
   Subjective:    Patient ID: Rhonda Scott, female    DOB: [birth year], 78 y.o.   MRN: [id number]  HPI  Pt is a 78 yo female who presents to the clinic with sinus pressure, nasal congestion, sinus drainage and cough for the last 2 weeks. She treated cold like symptoms with mucinex and delsym. She denies any SOB, wheezing, fever, body aches. She feels better except for her cough. Her cough and sinus congestion is productive with greenish sputum. No known sick contacts. She does have knee replacement coming up in a few weeks and wants to feel better before then.   .. Active Ambulatory Problems    Diagnosis Date Noted  . OSTEOPOROSIS 09/05/2010  . Cervical spondylosis 08/19/2014  . Lumbar degenerative disc disease 08/19/2014  . Post-traumatic osteoarthritis of left knee 04/16/2017   Resolved Ambulatory Problems    Diagnosis Date Noted  . Pyelonephritis 04/16/2017   No Additional Past Medical History       Review of Systems    see HPI.  Objective:   Physical Exam Vitals signs reviewed.  Constitutional:      Appearance: Normal appearance.  HENT:     Head: Normocephalic and atraumatic.     Right Ear: Tympanic membrane and ear canal normal.     Left Ear: Tympanic membrane and ear canal normal.     Ears:     Comments: Tenderness over maxillary sinuses to palpation.     Nose: Congestion present.     Mouth/Throat:     Mouth: Mucous membranes are moist.     Pharynx: No oropharyngeal exudate or posterior oropharyngeal erythema.  Eyes:     Extraocular Movements: Extraocular movements intact.     Conjunctiva/sclera: Conjunctivae normal.  Cardiovascular:     Rate and Rhythm: Normal rate and regular rhythm.     Pulses: Normal pulses.     Heart sounds: Normal heart sounds.  Pulmonary:     Effort: Pulmonary effort is normal.     Breath sounds: Normal breath sounds. No wheezing or rhonchi.  Neurological:     General: No focal deficit present.     Mental Status: She is alert and  oriented to person, place, and time.  Psychiatric:        Mood and Affect: Mood normal.        Behavior: Behavior normal.           Assessment & Plan:  Marland KitchenMarland KitchenMonita was seen today for cough.  Diagnoses and all orders for this visit:  Acute non-recurrent sinusitis, unspecified location -     amoxicillin-clavulanate (AUGMENTIN) 875-125 MG tablet; Take 1 tablet by mouth 2 (two) times daily.   2 weeks of symptoms treated sinusitis. Continue to use delsym for cough. Reassured that lungs sound good and vitals look good. Follow up if cough persists.

## 2018-10-27 NOTE — Patient Instructions (Signed)

## 2018-11-04 DIAGNOSIS — Z01818 Encounter for other preprocedural examination: Secondary | ICD-10-CM | POA: Diagnosis not present

## 2018-11-04 DIAGNOSIS — M1712 Unilateral primary osteoarthritis, left knee: Secondary | ICD-10-CM | POA: Diagnosis not present

## 2018-11-04 DIAGNOSIS — Z79899 Other long term (current) drug therapy: Secondary | ICD-10-CM | POA: Diagnosis not present

## 2018-11-13 DIAGNOSIS — Z969 Presence of functional implant, unspecified: Secondary | ICD-10-CM | POA: Diagnosis not present

## 2018-11-25 DIAGNOSIS — M810 Age-related osteoporosis without current pathological fracture: Secondary | ICD-10-CM | POA: Diagnosis not present

## 2018-11-25 DIAGNOSIS — M1712 Unilateral primary osteoarthritis, left knee: Secondary | ICD-10-CM | POA: Diagnosis not present

## 2018-11-25 DIAGNOSIS — Z471 Aftercare following joint replacement surgery: Secondary | ICD-10-CM | POA: Diagnosis not present

## 2018-11-25 DIAGNOSIS — M1732 Unilateral post-traumatic osteoarthritis, left knee: Secondary | ICD-10-CM | POA: Diagnosis not present

## 2018-11-25 DIAGNOSIS — T1490XS Injury, unspecified, sequela: Secondary | ICD-10-CM | POA: Diagnosis not present

## 2018-11-25 DIAGNOSIS — Z96652 Presence of left artificial knee joint: Secondary | ICD-10-CM | POA: Diagnosis not present

## 2018-11-25 DIAGNOSIS — M85662 Other cyst of bone, left lower leg: Secondary | ICD-10-CM | POA: Diagnosis not present

## 2018-11-25 DIAGNOSIS — Z7982 Long term (current) use of aspirin: Secondary | ICD-10-CM | POA: Diagnosis not present

## 2018-11-25 DIAGNOSIS — G8918 Other acute postprocedural pain: Secondary | ICD-10-CM | POA: Diagnosis not present

## 2018-11-25 DIAGNOSIS — Z881 Allergy status to other antibiotic agents status: Secondary | ICD-10-CM | POA: Diagnosis not present

## 2018-11-26 DIAGNOSIS — Z7982 Long term (current) use of aspirin: Secondary | ICD-10-CM | POA: Diagnosis not present

## 2018-11-26 DIAGNOSIS — M810 Age-related osteoporosis without current pathological fracture: Secondary | ICD-10-CM | POA: Diagnosis not present

## 2018-11-26 DIAGNOSIS — M1732 Unilateral post-traumatic osteoarthritis, left knee: Secondary | ICD-10-CM | POA: Diagnosis not present

## 2018-11-26 DIAGNOSIS — Z96652 Presence of left artificial knee joint: Secondary | ICD-10-CM | POA: Insufficient documentation

## 2018-11-26 DIAGNOSIS — T1490XS Injury, unspecified, sequela: Secondary | ICD-10-CM | POA: Diagnosis not present

## 2018-11-26 DIAGNOSIS — Z881 Allergy status to other antibiotic agents status: Secondary | ICD-10-CM | POA: Diagnosis not present

## 2018-11-26 DIAGNOSIS — M85662 Other cyst of bone, left lower leg: Secondary | ICD-10-CM | POA: Diagnosis not present

## 2018-11-27 DIAGNOSIS — Z96652 Presence of left artificial knee joint: Secondary | ICD-10-CM | POA: Diagnosis not present

## 2018-11-27 DIAGNOSIS — Z471 Aftercare following joint replacement surgery: Secondary | ICD-10-CM | POA: Diagnosis not present

## 2018-12-05 DIAGNOSIS — Z471 Aftercare following joint replacement surgery: Secondary | ICD-10-CM | POA: Diagnosis not present

## 2018-12-05 DIAGNOSIS — Z96652 Presence of left artificial knee joint: Secondary | ICD-10-CM | POA: Diagnosis not present

## 2018-12-06 DIAGNOSIS — Z471 Aftercare following joint replacement surgery: Secondary | ICD-10-CM | POA: Diagnosis not present

## 2018-12-06 DIAGNOSIS — Z96652 Presence of left artificial knee joint: Secondary | ICD-10-CM | POA: Diagnosis not present

## 2018-12-09 DIAGNOSIS — Z96652 Presence of left artificial knee joint: Secondary | ICD-10-CM | POA: Diagnosis not present

## 2018-12-09 DIAGNOSIS — Z471 Aftercare following joint replacement surgery: Secondary | ICD-10-CM | POA: Diagnosis not present

## 2018-12-11 DIAGNOSIS — R52 Pain, unspecified: Secondary | ICD-10-CM | POA: Diagnosis not present

## 2018-12-11 DIAGNOSIS — Z96652 Presence of left artificial knee joint: Secondary | ICD-10-CM | POA: Diagnosis not present

## 2018-12-11 DIAGNOSIS — Z471 Aftercare following joint replacement surgery: Secondary | ICD-10-CM | POA: Diagnosis not present

## 2018-12-15 DIAGNOSIS — M2652 Limited mandibular range of motion: Secondary | ICD-10-CM | POA: Diagnosis not present

## 2018-12-15 DIAGNOSIS — Z96652 Presence of left artificial knee joint: Secondary | ICD-10-CM | POA: Diagnosis not present

## 2018-12-15 DIAGNOSIS — Z471 Aftercare following joint replacement surgery: Secondary | ICD-10-CM | POA: Diagnosis not present

## 2018-12-15 DIAGNOSIS — R269 Unspecified abnormalities of gait and mobility: Secondary | ICD-10-CM | POA: Diagnosis not present

## 2018-12-23 DIAGNOSIS — M2652 Limited mandibular range of motion: Secondary | ICD-10-CM | POA: Diagnosis not present

## 2018-12-23 DIAGNOSIS — R269 Unspecified abnormalities of gait and mobility: Secondary | ICD-10-CM | POA: Diagnosis not present

## 2018-12-23 DIAGNOSIS — Z471 Aftercare following joint replacement surgery: Secondary | ICD-10-CM | POA: Diagnosis not present

## 2018-12-23 DIAGNOSIS — Z96652 Presence of left artificial knee joint: Secondary | ICD-10-CM | POA: Diagnosis not present

## 2018-12-25 DIAGNOSIS — Z471 Aftercare following joint replacement surgery: Secondary | ICD-10-CM | POA: Diagnosis not present

## 2018-12-25 DIAGNOSIS — Z96652 Presence of left artificial knee joint: Secondary | ICD-10-CM | POA: Diagnosis not present

## 2018-12-25 DIAGNOSIS — R269 Unspecified abnormalities of gait and mobility: Secondary | ICD-10-CM | POA: Diagnosis not present

## 2018-12-25 DIAGNOSIS — M2652 Limited mandibular range of motion: Secondary | ICD-10-CM | POA: Diagnosis not present

## 2019-02-19 DIAGNOSIS — Z96652 Presence of left artificial knee joint: Secondary | ICD-10-CM | POA: Diagnosis not present

## 2019-02-19 DIAGNOSIS — M545 Low back pain: Secondary | ICD-10-CM | POA: Diagnosis not present

## 2019-02-26 DIAGNOSIS — M4854XA Collapsed vertebra, not elsewhere classified, thoracic region, initial encounter for fracture: Secondary | ICD-10-CM | POA: Diagnosis not present

## 2019-02-26 DIAGNOSIS — Z96652 Presence of left artificial knee joint: Secondary | ICD-10-CM | POA: Diagnosis not present

## 2019-02-26 DIAGNOSIS — M545 Low back pain: Secondary | ICD-10-CM | POA: Diagnosis not present

## 2019-03-05 DIAGNOSIS — M47814 Spondylosis without myelopathy or radiculopathy, thoracic region: Secondary | ICD-10-CM | POA: Diagnosis not present

## 2019-03-05 DIAGNOSIS — M1712 Unilateral primary osteoarthritis, left knee: Secondary | ICD-10-CM | POA: Diagnosis not present

## 2019-03-05 DIAGNOSIS — M8000XA Age-related osteoporosis with current pathological fracture, unspecified site, initial encounter for fracture: Secondary | ICD-10-CM | POA: Diagnosis not present

## 2019-03-05 DIAGNOSIS — M47816 Spondylosis without myelopathy or radiculopathy, lumbar region: Secondary | ICD-10-CM | POA: Diagnosis not present

## 2019-03-05 DIAGNOSIS — S22070A Wedge compression fracture of T9-T10 vertebra, initial encounter for closed fracture: Secondary | ICD-10-CM | POA: Diagnosis not present

## 2019-03-07 DIAGNOSIS — M47814 Spondylosis without myelopathy or radiculopathy, thoracic region: Secondary | ICD-10-CM | POA: Insufficient documentation

## 2019-03-07 DIAGNOSIS — S22070A Wedge compression fracture of T9-T10 vertebra, initial encounter for closed fracture: Secondary | ICD-10-CM | POA: Insufficient documentation

## 2019-03-20 DIAGNOSIS — M8000XD Age-related osteoporosis with current pathological fracture, unspecified site, subsequent encounter for fracture with routine healing: Secondary | ICD-10-CM | POA: Diagnosis not present

## 2019-03-20 DIAGNOSIS — S22070D Wedge compression fracture of T9-T10 vertebra, subsequent encounter for fracture with routine healing: Secondary | ICD-10-CM | POA: Diagnosis not present

## 2019-03-20 DIAGNOSIS — Z79891 Long term (current) use of opiate analgesic: Secondary | ICD-10-CM | POA: Diagnosis not present

## 2019-03-20 DIAGNOSIS — M47814 Spondylosis without myelopathy or radiculopathy, thoracic region: Secondary | ICD-10-CM | POA: Diagnosis not present

## 2019-03-20 DIAGNOSIS — M47816 Spondylosis without myelopathy or radiculopathy, lumbar region: Secondary | ICD-10-CM | POA: Diagnosis not present

## 2019-04-08 DIAGNOSIS — M810 Age-related osteoporosis without current pathological fracture: Secondary | ICD-10-CM | POA: Diagnosis not present

## 2019-04-08 DIAGNOSIS — Z8781 Personal history of (healed) traumatic fracture: Secondary | ICD-10-CM | POA: Diagnosis not present

## 2019-04-08 DIAGNOSIS — Z780 Asymptomatic menopausal state: Secondary | ICD-10-CM | POA: Diagnosis not present

## 2019-04-08 DIAGNOSIS — Z79899 Other long term (current) drug therapy: Secondary | ICD-10-CM | POA: Diagnosis not present

## 2019-04-08 DIAGNOSIS — Z1321 Encounter for screening for nutritional disorder: Secondary | ICD-10-CM | POA: Diagnosis not present

## 2019-04-10 LAB — HEPATIC FUNCTION PANEL
ALT: 8 (ref 7–35)
AST: 16 (ref 13–35)
Alkaline Phosphatase: 73 (ref 25–125)
Bilirubin, Total: 0.3

## 2019-04-10 LAB — BASIC METABOLIC PANEL
BUN: 21 (ref 4–21)
Creatinine: 0.8 (ref 0.5–1.1)
Glucose: 83
Potassium: 3.9 (ref 3.4–5.3)
Sodium: 141 (ref 137–147)

## 2019-04-10 LAB — VITAMIN D 25 HYDROXY (VIT D DEFICIENCY, FRACTURES): Vit D, 25-Hydroxy: 22.4

## 2019-04-15 DIAGNOSIS — M810 Age-related osteoporosis without current pathological fracture: Secondary | ICD-10-CM | POA: Diagnosis not present

## 2019-04-15 LAB — HM DEXA SCAN

## 2019-04-23 ENCOUNTER — Encounter: Payer: Self-pay | Admitting: Family Medicine

## 2019-04-30 ENCOUNTER — Encounter: Payer: Self-pay | Admitting: Family Medicine

## 2019-04-30 ENCOUNTER — Telehealth: Payer: Self-pay | Admitting: Family Medicine

## 2019-04-30 NOTE — Telephone Encounter (Signed)
Please call patient: Based on her bone density test she does have osteoporosis.  She would highly benefit from starting a bone building medication such as Boniva or Fosamax.  Fosamax for example is generic and is taken once a week to help strengthen bones.  Her T score is extremely low.  Please also see if she has fallen in the last year and document her fall risk.

## 2019-04-30 NOTE — Telephone Encounter (Signed)
No falls. Fall risk completed

## 2019-04-30 NOTE — Telephone Encounter (Signed)
Patient advised of results and recommendations. States she will discuss options of medications with Dr Madilyn Fireman at visit tomorrow.

## 2019-05-01 ENCOUNTER — Telehealth: Payer: Self-pay | Admitting: Family Medicine

## 2019-05-01 ENCOUNTER — Ambulatory Visit (INDEPENDENT_AMBULATORY_CARE_PROVIDER_SITE_OTHER): Payer: Medicare HMO | Admitting: Family Medicine

## 2019-05-01 ENCOUNTER — Other Ambulatory Visit: Payer: Self-pay

## 2019-05-01 ENCOUNTER — Encounter: Payer: Self-pay | Admitting: Family Medicine

## 2019-05-01 VITALS — BP 105/88 | HR 65 | Temp 98.5°F | Ht 63.0 in | Wt 139.0 lb

## 2019-05-01 DIAGNOSIS — E559 Vitamin D deficiency, unspecified: Secondary | ICD-10-CM | POA: Diagnosis not present

## 2019-05-01 DIAGNOSIS — L989 Disorder of the skin and subcutaneous tissue, unspecified: Secondary | ICD-10-CM

## 2019-05-01 DIAGNOSIS — M810 Age-related osteoporosis without current pathological fracture: Secondary | ICD-10-CM

## 2019-05-01 DIAGNOSIS — R7989 Other specified abnormal findings of blood chemistry: Secondary | ICD-10-CM | POA: Insufficient documentation

## 2019-05-01 DIAGNOSIS — Z85828 Personal history of other malignant neoplasm of skin: Secondary | ICD-10-CM | POA: Insufficient documentation

## 2019-05-01 NOTE — Assessment & Plan Note (Signed)
Discussed that this does need additional work-up including a repeat TSH as well as T4 and T3 and thyroid antibodies to see if she is truly hypothyroid or possibly just subclinically hypothyroid.  I do find it interesting that there are 2 sisters who are hypothyroid.  We will go from there.

## 2019-05-01 NOTE — Patient Instructions (Signed)
We will see if we can get Prolia covered through your insurance and let you know. Plan will be to recheck your vitamin D in about 2 to 3 months. Recommend start an over-the-counter calcium supplement such as Caltrate D or the Viactiv chews.  Just make sure to take 1 twice a day. We will work on getting you referred for the skin lesion on your nose.  Just let us know if you have not heard from somebody by the middle of next week.

## 2019-05-01 NOTE — Telephone Encounter (Signed)
Patient has osteoporosis with a T score of -4.2.  She would like to get approved for Prolia.  I did explain to her that she may have to try bisphosphonate first but we could certainly try to see if we can get it approved.

## 2019-05-01 NOTE — Progress Notes (Addendum)
Established Patient Office Visit  Subjective:  Patient ID: Rhonda Scott, female    DOB: 1941-01-25  Age: 78 y.o. MRN: [id number]  CC:  Chief Complaint  Patient presents with  . Follow-up    HPI Rhonda Scott presents for follow-up of several issues.  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  They found a spinal compression fracture and then referred her to the osteoporosis clinic where she had a DEXA scan.  He did have a follow-up MRI which did not indicate anything worrisome.  F/U osteoporosis - not currently on medication.  T score of -4.2 in the lumbar spine.  She was actually seen and evaluated at the osteoporosis clinic at [facility].  Is potentially interested in doing Prolia versus a bisphosphonate and wanted to discuss that today.  He was also told that she was vitamin D deficient and so they had recommended that she increase her 2000 IU daily up to 4000 IU daily.   F/U thyroid -she also recently had labs through [facility] where TSH was elevated at 7.1.  She has never had any thyroid problems previously but interestingly 2 of her sisters are hypothyroid and take thyroid medication.  She has not had any significant symptoms. Down about 4 lbs from last time she was here.   She has also noticed a lesion on the left side of her nose it is been there for about 6 months.  She says sometimes she will scratch at it and it bleeds but otherwise is not itchy or bothersome it has gotten slowly larger over time.  She has had a prior history of basal cell skin cancer.   Care Everywhere Result Report TSHResulted: 04/10/2019 3:35 PM [facility] Component Name Value Ref Range  TSH 7.150 (H) 0.45 - 4.5 uIU/mL  Specimen Collected on  Blood - Entire vein (body structure) 04/08/2019 8:45 AM   She also has a spot on her nose she would like me to look at today.  History reviewed. No pertinent past medical  history.  Past Surgical History:  Procedure Laterality Date  . APPENDECTOMY  1968  . KNEE SURGERY  1979    Family History  Problem Relation Age of Onset  . Hypothyroidism Sister   . Hypothyroidism Sister     Social History   Socioeconomic History  . Marital status: Married    Spouse name: Not on file  . Number of children: Not on file  . Years of education: Not on file  . Highest education level: Not on file  Occupational History  . Not on file  Social Needs  . Financial resource strain: Not on file  . Food insecurity    Worry: Not on file    Inability: Not on file  . Transportation needs    Medical: Not on file    Non-medical: Not on file  Tobacco Use  . Smoking status: Never Smoker  . Smokeless tobacco: Never Used  Substance and Sexual Activity  . Alcohol use: No  . Drug use: No  . Sexual activity: Not on file  Lifestyle  . Physical activity    Days per week: Not on file    Minutes per session: Not on file  . Stress: Not on file  Relationships  . Social Herbalist on phone: Not on file    Gets together: Not on file    Attends religious service: Not on  file    Active member of club or organization: Not on file    Attends meetings of clubs or organizations: Not on file    Relationship status: Not on file  . Intimate partner violence    Fear of current or ex partner: Not on file    Emotionally abused: Not on file    Physically abused: Not on file    Forced sexual activity: Not on file  Other Topics Concern  . Not on file  Social History Narrative  . Not on file    Outpatient Medications Prior to Visit  Medication Sig Dispense Refill  . amoxicillin-clavulanate (AUGMENTIN) 875-125 MG tablet Take 1 tablet by mouth 2 (two) times daily. 20 tablet 0   No facility-administered medications prior to visit.     Not on File  ROS Review of Systems    Objective:    Physical Exam  Constitutional: She is oriented to person, place, and time. She  appears well-developed and well-nourished.  HENT:  Head: Normocephalic and atraumatic.  Right Ear: External ear normal.  Left Ear: External ear normal.  Eyes: Conjunctivae are normal.  Cardiovascular: Normal rate, regular rhythm and normal heart sounds.  Pulmonary/Chest: Effort normal and breath sounds normal.  Neurological: She is alert and oriented to person, place, and time.  On the left side of her nose she has a large lesion almost a centimeter in size that is raised and shiny and was pearlescent with some vascularity to it.  Skin: Skin is warm and dry.  Psychiatric: She has a normal mood and affect. Her behavior is normal.    BP 105/88   Pulse 65   Temp 98.5 F (36.9 C) (Oral)   Ht 5\' 3"  (1.6 m)   Wt 139 lb (63 kg)   BMI 24.62 kg/m  Wt Readings from Last 3 Encounters:  05/01/19 139 lb (63 kg)  10/27/18 143 lb (64.9 kg)  06/06/17 139 lb 11.2 oz (63.4 kg)     Health Maintenance Due  Topic Date Due  . PNA vac Low Risk Adult (2 of 2 - PCV13) 06/22/2010    There are no preventive care reminders to display for this patient.  Lab Results  Component Value Date   TSH 3.559 06/22/2009   No results found for: WBC, HGB, HCT, MCV, PLT Lab Results  Component Value Date   NA 139 09/04/2010   K 5.3 09/04/2010   CO2 24 09/04/2010   GLUCOSE 101 (H) 09/04/2010   BUN 17 09/04/2010   CREATININE 0.89 09/04/2010   BILITOT 0.7 09/04/2010   ALKPHOS 61 09/04/2010   AST 16 09/04/2010   ALT 13 09/04/2010   PROT 7.1 09/04/2010   ALBUMIN 4.5 09/04/2010   CALCIUM 10.2 09/04/2010   Lab Results  Component Value Date   CHOL 229 (H) 09/04/2010   Lab Results  Component Value Date   HDL 90 09/04/2010   Lab Results  Component Value Date   LDLCALC 120 (H) 09/04/2010   Lab Results  Component Value Date   TRIG 97 09/04/2010   Lab Results  Component Value Date   CHOLHDL 2.5 Ratio 09/04/2010   No results found for: HGBA1C    Assessment & Plan:   Problem List Items  Addressed This Visit      Musculoskeletal and Integument   Osteoporosis - Primary    We discussed adequat calcium with vitamin D in addition to a bone builder such as a bisphosphonate. Also regular weight bearing exercise.  History of basal cell cancer     Other   Vitamin D deficiency    On 2000 IU daily. Plan to recheck in 8 weeks.       Abnormal TSH    Discussed that this does need additional work-up including a repeat TSH as well as T4 and T3 and thyroid antibodies to see if she is truly hypothyroid or possibly just subclinically hypothyroid.  I do find it interesting that there are 2 sisters who are hypothyroid.  We will go from there.      Relevant Orders   Thyroid Panel With TSH   Thyroid peroxidase antibody    Other Visit Diagnoses    Lesion of skin of nose       Relevant Orders   Ambulatory referral to Dermatology      In regards to the lesion on her nose I am very concerned based on the appearance that it is a another basal cell skin cancer and she Artie has a prior history.  I am going refer her to a Mohs surgeon in [place] for further evaluation and excision.  No orders of the defined types were placed in this encounter.   Follow-up: Return in about 3 months (around 08/01/2019), or m+2, for recheck thyroid and vitamin D .    Beatrice Lecher, MD

## 2019-05-01 NOTE — Assessment & Plan Note (Signed)
We discussed adequat calcium with vitamin D in addition to a bone builder such as a bisphosphonate. Also regular weight bearing exercise.

## 2019-05-01 NOTE — Assessment & Plan Note (Signed)
On 2000 IU daily. Plan to recheck in 8 weeks.

## 2019-05-04 ENCOUNTER — Telehealth: Payer: Self-pay

## 2019-05-04 NOTE — Telephone Encounter (Signed)
Absolutely, please place referral.

## 2019-05-04 NOTE — Telephone Encounter (Signed)
Patient does want to try the Prolia. Please do a PA.

## 2019-05-04 NOTE — Telephone Encounter (Signed)
Please change the referral to a [place] provider

## 2019-05-04 NOTE — Telephone Encounter (Signed)
Rhonda Scott called and asked if she could see Dermatologist in [place].

## 2019-05-04 NOTE — Telephone Encounter (Signed)
[facility] in [place], [place]  Address: [address], [place], [place] [address]  Phone: [phone]

## 2019-05-04 NOTE — Telephone Encounter (Signed)
We do not have an updated copy of the insurance card on file we have [employer] scanned in the chart and I have to have a paper copy of the card so I can work on this for the patient. Can someone get a copy of this so the patient can get her medication?

## 2019-05-05 DIAGNOSIS — L821 Other seborrheic keratosis: Secondary | ICD-10-CM | POA: Diagnosis not present

## 2019-05-05 DIAGNOSIS — L728 Other follicular cysts of the skin and subcutaneous tissue: Secondary | ICD-10-CM | POA: Diagnosis not present

## 2019-05-05 DIAGNOSIS — C44311 Basal cell carcinoma of skin of nose: Secondary | ICD-10-CM | POA: Diagnosis not present

## 2019-05-05 NOTE — Telephone Encounter (Signed)
Information has been filled out and waiting on a response from [employer].

## 2019-05-05 NOTE — Telephone Encounter (Signed)
Called PT and confirmed they have not received any new insurance cards for 2020. Verified the card scanned in 2019 is up to date.

## 2019-05-07 DIAGNOSIS — Z79891 Long term (current) use of opiate analgesic: Secondary | ICD-10-CM | POA: Diagnosis not present

## 2019-05-07 DIAGNOSIS — S22070D Wedge compression fracture of T9-T10 vertebra, subsequent encounter for fracture with routine healing: Secondary | ICD-10-CM | POA: Diagnosis not present

## 2019-05-07 DIAGNOSIS — Z5181 Encounter for therapeutic drug level monitoring: Secondary | ICD-10-CM | POA: Diagnosis not present

## 2019-05-07 DIAGNOSIS — M8000XD Age-related osteoporosis with current pathological fracture, unspecified site, subsequent encounter for fracture with routine healing: Secondary | ICD-10-CM | POA: Diagnosis not present

## 2019-05-08 DIAGNOSIS — E0590 Thyrotoxicosis, unspecified without thyrotoxic crisis or storm: Secondary | ICD-10-CM | POA: Diagnosis not present

## 2019-05-08 DIAGNOSIS — R7989 Other specified abnormal findings of blood chemistry: Secondary | ICD-10-CM | POA: Diagnosis not present

## 2019-05-11 LAB — THYROID PANEL WITH TSH
Free Thyroxine Index: 2.2 (ref 1.4–3.8)
T3 Uptake: 32 % (ref 22–35)
T4, Total: 6.9 ug/dL (ref 5.1–11.9)
TSH: 6.62 mIU/L — ABNORMAL HIGH (ref 0.40–4.50)

## 2019-05-11 LAB — THYROID PEROXIDASE ANTIBODY: Thyroperoxidase Ab SerPl-aCnc: 1 IU/mL (ref <9)

## 2019-05-14 ENCOUNTER — Encounter: Payer: Self-pay | Admitting: Family Medicine

## 2019-05-14 DIAGNOSIS — C44311 Basal cell carcinoma of skin of nose: Secondary | ICD-10-CM | POA: Diagnosis not present

## 2019-05-15 NOTE — Telephone Encounter (Signed)
Received a fax that Insurance will cover the Prolia for the patient. She has an appointment.

## 2019-05-19 ENCOUNTER — Telehealth: Payer: Self-pay | Admitting: Family Medicine

## 2019-05-19 ENCOUNTER — Ambulatory Visit (INDEPENDENT_AMBULATORY_CARE_PROVIDER_SITE_OTHER): Payer: Medicare HMO | Admitting: Family Medicine

## 2019-05-19 ENCOUNTER — Other Ambulatory Visit: Payer: Self-pay

## 2019-05-19 VITALS — BP 129/57 | HR 65

## 2019-05-19 DIAGNOSIS — M810 Age-related osteoporosis without current pathological fracture: Secondary | ICD-10-CM | POA: Diagnosis not present

## 2019-05-19 MED ORDER — DENOSUMAB 60 MG/ML ~~LOC~~ SOSY
60.0000 mg | PREFILLED_SYRINGE | Freq: Once | SUBCUTANEOUS | Status: AC
  Administered 2019-05-19: 60 mg via SUBCUTANEOUS

## 2019-05-19 NOTE — Progress Notes (Signed)
Prolia injection

## 2019-05-19 NOTE — Telephone Encounter (Signed)
Patient is scheduled today for Prolia injection for osteoporosis.  She had labs on June 26.  Creatinine and calcium were normal.  Safe for Prolia injection today.  Within 6 weeks.    Care Everywhere Result Report CMP14+PTH Intact+Vit D25Resulted: 04/10/2019 3:35 PM [facility] Component Name Value Ref Range  Glucose 83 65 - 99 mg/dL  BUN 21 8 - 27 mg/dL  Creatinine, Serum 0.81 0.57 - 1 mg/dL  eGFR If NonAfrican American 69 >59 mL/min/1.73  eGFR If African American 80 >59 mL/min/1.73  BUN/Creatinine Ratio 26 12 - 28   Sodium 141 134 - 144 mmol/L  Potassium 3.9 3.5 - 5.2 mmol/L  Chloride 105 96 - 106 mmol/L  CO2 21 20 - 29 mmol/L  CALCIUM 10.0 8.7 - 10.3 mg/dL  Parathyroid Hormone Intact 25 15 - 65 pg/mL  Intact Pth Comment  Comment: Interpretation         Intact PTH  Calcium                 (pg/mL)   (mg/dL) Normal             15 - 65   8.6 - 10.2 Primary Hyperparathyroidism     >65     >10.2 Secondary Hyperparathyroidism    >65     <10.2 Non-Parathyroid Hypercalcemia    <65     >10.2 Hypoparathyroidism         <15     < 8.6 Non-Parathyroid Hypocalcemia  15 - 65     < 8.6   Total Protein 6.9 6 - 8.5 g/dL  Albumin, Serum 4.3 3.7 - 4.7 g/dL  Globulin, Total 2.6 1.5 - 4.5 g/dL  Albumin/Globulin Ratio 1.7 1.2 - 2.2   Total Bilirubin 0.3 0 - 1.2 mg/dL  Alkaline Phosphatase 73 39 - 117 IU/L  AST 16 0 - 40 IU/L  ALT (SGPT) 8 0 - 32 IU/L  Vit D, 25-Hydroxy 22.4 (L)  Comment: Vitamin D deficiency has been defined by the Institute of Medicine and an Endocrine Society practice guideline as a level of serum 25-OH vitamin D less than 20 ng/mL (1,2). The Endocrine Society went on to further define vitamin D insufficiency as a level between 21 and 29 ng/mL (2). 1. IOM (Institute of Medicine). 2010. Dietary reference   intakes for calcium and D. [place]: The   National Academies  Press. 2. Holick MF, Binkley Onamia, Bischoff-Ferrari HA, et al.   Evaluation, treatment, and prevention of vitamin D   deficiency: an Endocrine Society clinical practice   guideline. JCEM. 2011 Jul; 96(7):1911-30. 30 - 100 ng/mL  Specimen Collected on  Blood - Entire vein (body structure) 04/08/2019 8:45 AM  Result Narrative  Performed at: 01 - [facility] [address], [place], [place] [id number] Lab Director: Sanjai Nagendra MD, Phone: [phone]    

## 2019-05-28 NOTE — Telephone Encounter (Signed)
Changed location of referral to Dermatology and skin surgery center as patient request - [facility]

## 2019-08-03 ENCOUNTER — Encounter: Payer: Self-pay | Admitting: Family Medicine

## 2019-08-03 ENCOUNTER — Other Ambulatory Visit: Payer: Self-pay

## 2019-08-03 ENCOUNTER — Ambulatory Visit (INDEPENDENT_AMBULATORY_CARE_PROVIDER_SITE_OTHER): Payer: Medicare HMO | Admitting: Family Medicine

## 2019-08-03 VITALS — BP 106/57 | HR 63 | Ht 63.0 in | Wt 140.0 lb

## 2019-08-03 DIAGNOSIS — R7989 Other specified abnormal findings of blood chemistry: Secondary | ICD-10-CM | POA: Diagnosis not present

## 2019-08-03 DIAGNOSIS — E559 Vitamin D deficiency, unspecified: Secondary | ICD-10-CM

## 2019-08-03 DIAGNOSIS — F5101 Primary insomnia: Secondary | ICD-10-CM | POA: Diagnosis not present

## 2019-08-03 DIAGNOSIS — Z23 Encounter for immunization: Secondary | ICD-10-CM

## 2019-08-03 DIAGNOSIS — M810 Age-related osteoporosis without current pathological fracture: Secondary | ICD-10-CM

## 2019-08-03 DIAGNOSIS — E039 Hypothyroidism, unspecified: Secondary | ICD-10-CM | POA: Diagnosis not present

## 2019-08-03 MED ORDER — DOXEPIN HCL 10 MG/ML PO CONC: 5.0000 mg | Freq: Every day | ORAL | 1 refills | Status: DC

## 2019-08-03 NOTE — Progress Notes (Signed)
Established Patient Office Visit  Subjective:  Patient ID: Rhonda Scott, female    DOB: [birth year]  Age: 78 y.o. MRN: [id number]  CC:  Chief Complaint  Patient presents with  . Hypothyroidism    HPI Rhonda Scott presents for   For follow-up of her thyroid.  Recent TSH levels were mildly elevated at 6.6 T4 and thyroid peroxidase antibody levels were normal.  She is not having specific symptoms.  Last TSH was normal 10 years ago.  She is due to have it followed up.  Vitamin D  deficiency-she has been taking a calcium with vitamin D supplement twice a day.  She was recently diagnosed with osteoporosis and was started on Prolia.  Gently her part of the cost was about $200 so when she is due for her next injection she would like to discuss alternatives.  She is open to trying a pill.  Also had trouble sleeping.  Recently she was on tramadol for her low back pain.  She now just takes it at bedtime but says if she does not take it and she has trouble falling asleep.  She says it is more because she feels restless not because she is having pain.  She wonders if there is something else that would be better for her to take.   No past medical history on file.  Past Surgical History:  Procedure Laterality Date  . APPENDECTOMY  1968  . KNEE SURGERY  1979    Family History  Problem Relation Age of Onset  . Hypothyroidism Sister   . Hypothyroidism Sister     Social History   Socioeconomic History  . Marital status: Married    Spouse name: Not on file  . Number of children: Not on file  . Years of education: Not on file  . Highest education level: Not on file  Occupational History  . Not on file  Social Needs  . Financial resource strain: Not on file  . Food insecurity    Worry: Not on file    Inability: Not on file  . Transportation needs    Medical: Not on file    Non-medical: Not on file  Tobacco Use  . Smoking status: Never Smoker  . Smokeless tobacco: Never Used   Substance and Sexual Activity  . Alcohol use: No  . Drug use: No  . Sexual activity: Not on file  Lifestyle  . Physical activity    Days per week: Not on file    Minutes per session: Not on file  . Stress: Not on file  Relationships  . Social Herbalist on phone: Not on file    Gets together: Not on file    Attends religious service: Not on file    Active member of club or organization: Not on file    Attends meetings of clubs or organizations: Not on file    Relationship status: Not on file  . Intimate partner violence    Fear of current or ex partner: Not on file    Emotionally abused: Not on file    Physically abused: Not on file    Forced sexual activity: Not on file  Other Topics Concern  . Not on file  Social History Narrative  . Not on file    Outpatient Medications Prior to Visit  Medication Sig Dispense Refill  . traMADol (ULTRAM) 50 MG tablet TAKE 1 TO 2 TABLETS BY MOUTH TWICE DAILY AS NEEDED FOR PAIN FOR UP  TO 30 DAYS    . acetaminophen (TYLENOL) 325 MG tablet Take by mouth.    . Calcium Carbonate (CALCIUM 600 PO) Take 600 mg by mouth 2 (two) times daily.    . Cholecalciferol (VITAMIN D3) 50 MCG (2000 UT) TABS Take by mouth.    . meloxicam (MOBIC) 15 MG tablet Take by mouth.     No facility-administered medications prior to visit.     No Known Allergies  ROS Review of Systems    Objective:    Physical Exam  Constitutional: She is oriented to person, place, and time. She appears well-developed and well-nourished.  HENT:  Head: Normocephalic and atraumatic.  Neck: Thyromegaly present.  Thyromegaly or mass.  Cardiovascular: Normal rate, regular rhythm and normal heart sounds.  Pulmonary/Chest: Effort normal and breath sounds normal.  Neurological: She is alert and oriented to person, place, and time.  Skin: Skin is warm and dry.  Psychiatric: She has a normal mood and affect. Her behavior is normal.    BP (!) 106/57   Pulse 63   Ht 5'  3" (1.6 m)   Wt 140 lb (63.5 kg)   SpO2 100%   BMI 24.80 kg/m  Wt Readings from Last 3 Encounters:  08/03/19 140 lb (63.5 kg)  05/01/19 139 lb (63 kg)  10/27/18 143 lb (64.9 kg)     Health Maintenance Due  Topic Date Due  . PNA vac Low Risk Adult (2 of 2 - PCV13) 06/22/2010  . INFLUENZA VACCINE  05/16/2019  . TETANUS/TDAP  06/23/2019    There are no preventive care reminders to display for this patient.  Lab Results  Component Value Date   TSH 6.62 (H) 05/08/2019   No results found for: WBC, HGB, HCT, MCV, PLT Lab Results  Component Value Date   NA 141 04/10/2019   K 3.9 04/10/2019   CO2 24 09/04/2010   GLUCOSE 101 (H) 09/04/2010   BUN 21 04/10/2019   CREATININE 0.8 04/10/2019   BILITOT 0.7 09/04/2010   ALKPHOS 73 04/10/2019   AST 16 04/10/2019   ALT 8 04/10/2019   PROT 7.1 09/04/2010   ALBUMIN 4.5 09/04/2010   CALCIUM 10.2 09/04/2010   Lab Results  Component Value Date   CHOL 229 (H) 09/04/2010   Lab Results  Component Value Date   HDL 90 09/04/2010   Lab Results  Component Value Date   LDLCALC 120 (H) 09/04/2010   Lab Results  Component Value Date   TRIG 97 09/04/2010   Lab Results  Component Value Date   CHOLHDL 2.5 Ratio 09/04/2010   No results found for: HGBA1C    Assessment & Plan:   Problem List Items Addressed This Visit      Musculoskeletal and Integument   Osteoporosis    Sounds like the Prolia is going to be quite costly for her so we will plan to switch to a bisphosphonate when she is due for her next Prolia injection.  It should be much more cost effective.  Continue with daily calcium with vitamin D.        Other   Vitamin D deficiency    To recheck vitamin D level she is currently taking a supplement.      Abnormal TSH - Primary    Plan to recheck TSH levels today.  Discussed that it may go back down to normal or she may have subclinical hypothyroidism in which case we would typically just monitor.  I did not feel any  thyroid abnormalities on exam today.      Relevant Orders   TSH + free T4    Other Visit Diagnoses    Need for immunization against influenza       Relevant Orders   Flu Vaccine QUAD High Dose(Fluad) (Completed)   Primary insomnia         F/U insomnia - discussed d/c the tramadol. Will try low dose doxepin.  Call if is worse excess sedation on the medication.   Meds ordered this encounter  Medications  . doxepin (SINEQUAN) 10 MG/ML solution    Sig: Take 0.5 mLs (5 mg total) by mouth at bedtime. For sleep as needed.    Dispense:  118 mL    Refill:  1    Follow-up: Return in about 6 months (around 02/01/2020).    Beatrice Lecher, MD

## 2019-08-03 NOTE — Assessment & Plan Note (Signed)
Sounds like the Prolia is going to be quite costly for her so we will plan to switch to a bisphosphonate when she is due for her next Prolia injection.  It should be much more cost effective.  Continue with daily calcium with vitamin D.

## 2019-08-03 NOTE — Assessment & Plan Note (Signed)
Plan to recheck TSH levels today.  Discussed that it may go back down to normal or she may have subclinical hypothyroidism in which case we would typically just monitor.  I did not feel any thyroid abnormalities on exam today.

## 2019-08-03 NOTE — Assessment & Plan Note (Signed)
To recheck vitamin D level she is currently taking a supplement.

## 2019-08-04 ENCOUNTER — Encounter: Payer: Self-pay | Admitting: Family Medicine

## 2019-08-04 DIAGNOSIS — E039 Hypothyroidism, unspecified: Secondary | ICD-10-CM | POA: Insufficient documentation

## 2019-08-04 DIAGNOSIS — E038 Other specified hypothyroidism: Secondary | ICD-10-CM | POA: Insufficient documentation

## 2019-08-04 LAB — TSH+FREE T4: TSH W/REFLEX TO FT4: 5.44 mIU/L — ABNORMAL HIGH (ref 0.40–4.50)

## 2019-08-04 LAB — T4, FREE: Free T4: 1 ng/dL (ref 0.8–1.8)

## 2019-08-27 DIAGNOSIS — M7918 Myalgia, other site: Secondary | ICD-10-CM | POA: Diagnosis not present

## 2019-08-27 DIAGNOSIS — M47814 Spondylosis without myelopathy or radiculopathy, thoracic region: Secondary | ICD-10-CM | POA: Diagnosis not present

## 2019-08-27 DIAGNOSIS — M47816 Spondylosis without myelopathy or radiculopathy, lumbar region: Secondary | ICD-10-CM | POA: Diagnosis not present

## 2019-08-27 DIAGNOSIS — G894 Chronic pain syndrome: Secondary | ICD-10-CM | POA: Diagnosis not present

## 2019-08-27 DIAGNOSIS — M8000XD Age-related osteoporosis with current pathological fracture, unspecified site, subsequent encounter for fracture with routine healing: Secondary | ICD-10-CM | POA: Diagnosis not present

## 2019-08-31 DIAGNOSIS — L728 Other follicular cysts of the skin and subcutaneous tissue: Secondary | ICD-10-CM | POA: Diagnosis not present

## 2019-08-31 DIAGNOSIS — L739 Follicular disorder, unspecified: Secondary | ICD-10-CM | POA: Diagnosis not present

## 2019-08-31 DIAGNOSIS — C44311 Basal cell carcinoma of skin of nose: Secondary | ICD-10-CM | POA: Diagnosis not present

## 2019-09-07 DIAGNOSIS — M47816 Spondylosis without myelopathy or radiculopathy, lumbar region: Secondary | ICD-10-CM | POA: Diagnosis not present

## 2019-09-07 DIAGNOSIS — S32030G Wedge compression fracture of third lumbar vertebra, subsequent encounter for fracture with delayed healing: Secondary | ICD-10-CM | POA: Diagnosis not present

## 2019-09-14 DIAGNOSIS — M47817 Spondylosis without myelopathy or radiculopathy, lumbosacral region: Secondary | ICD-10-CM | POA: Diagnosis not present

## 2019-09-14 DIAGNOSIS — M47816 Spondylosis without myelopathy or radiculopathy, lumbar region: Secondary | ICD-10-CM | POA: Diagnosis not present

## 2019-09-14 DIAGNOSIS — M5137 Other intervertebral disc degeneration, lumbosacral region: Secondary | ICD-10-CM | POA: Diagnosis not present

## 2019-09-14 DIAGNOSIS — S32030G Wedge compression fracture of third lumbar vertebra, subsequent encounter for fracture with delayed healing: Secondary | ICD-10-CM | POA: Diagnosis not present

## 2019-09-14 DIAGNOSIS — M5136 Other intervertebral disc degeneration, lumbar region: Secondary | ICD-10-CM | POA: Diagnosis not present

## 2019-09-29 DIAGNOSIS — M5136 Other intervertebral disc degeneration, lumbar region: Secondary | ICD-10-CM | POA: Diagnosis not present

## 2019-09-29 DIAGNOSIS — M4726 Other spondylosis with radiculopathy, lumbar region: Secondary | ICD-10-CM | POA: Diagnosis not present

## 2019-10-27 ENCOUNTER — Ambulatory Visit (INDEPENDENT_AMBULATORY_CARE_PROVIDER_SITE_OTHER): Payer: Medicare HMO | Admitting: *Deleted

## 2019-10-27 VITALS — Ht 63.0 in | Wt 142.0 lb

## 2019-10-27 DIAGNOSIS — Z0000 Encounter for general adult medical examination without abnormal findings: Secondary | ICD-10-CM

## 2019-10-27 NOTE — Patient Instructions (Addendum)
Please schedule your next medicare wellness visit with me in 1 yr.  Rhonda Scott , Thank you for taking time to come for your Medicare Wellness Visit. I appreciate your ongoing commitment to your health goals. Please review the following plan we discussed and let me know if I can assist you in the future.  Continue doing brain stimulating activities (puzzles, reading, adult coloring books, staying active) to keep memory sharp.  Bring a copy of your living will and/or healthcare power of attorney to your next office visit. These are the goals we discussed: Goals    . Weight (lb) < 200 lb (90.7 kg)     Loose 10 lbs this year.

## 2019-10-27 NOTE — Progress Notes (Signed)
Subjective:   Anshika Raffensperger is a 79 y.o. female who presents for an Initial Medicare Annual Wellness Visit.  Review of Systems    No ROS.  Medicare Wellness Virtual Visit.  Visual/audio telehealth visit, UTA vital signs.   See social history for additional risk factors.     Cardiac Risk Factors include: advanced age (>43men, >15 women) Sleep patterns:Getting on average 6 hours of sleep a night. Wakes up 1 time to void during the night. Wakes up and feels rested and ready for the day.   Home Safety/Smoke Alarms: Feels safe in home. Smoke alarms in place.  Living environment; Lives with husband in a 1 story home and no stairs in or around the home. Shower is a walk in shower and bench in place. Seat Belt Safety/Bike Helmet: Wears seat belt.   Female:   Pap- Aged out      Mammo- Aged out       Dexa scan- UTD       CCS- Aged out     Objective:    Today's Vitals   10/27/19 1419  Weight: 142 lb (64.4 kg)  Height: 5\' 3"  (1.6 m)   Body mass index is 25.15 kg/m.  Advanced Directives 10/27/2019  Does Patient Have a Medical Advance Directive? Yes  Type of Paramedic of [place];Living will  Does patient want to make changes to medical advance directive? No - Patient declined  Copy of [place] in Chart? No - copy requested    Current Medications (verified) Outpatient Encounter Medications as of 10/27/2019  Medication Sig  . acetaminophen (TYLENOL) 325 MG tablet Take by mouth.  . Calcium Carbonate (CALCIUM 600 PO) Take 600 mg by mouth 2 (two) times daily.  . Cholecalciferol (VITAMIN D3) 50 MCG (2000 UT) TABS Take by mouth.  . doxepin (SINEQUAN) 10 MG/ML solution Take 0.5 mLs (5 mg total) by mouth at bedtime. For sleep as needed. (Patient not taking: Reported on 10/27/2019)  . meloxicam (MOBIC) 15 MG tablet Take by mouth.  . traMADol (ULTRAM) 50 MG tablet TAKE 1 TO 2 TABLETS BY MOUTH TWICE DAILY AS NEEDED FOR PAIN FOR UP TO 30 DAYS    No facility-administered encounter medications on file as of 10/27/2019.    Allergies (verified) Vancomycin   History: Past Medical History:  Diagnosis Date  . DDD (degenerative disc disease), lumbar    Dr. Francesco Runner   Past Surgical History:  Procedure Laterality Date  . APPENDECTOMY  1968  . KNEE SURGERY  1979   Family History  Problem Relation Age of Onset  . Hypothyroidism Sister   . Hypothyroidism Sister    Social History   Socioeconomic History  . Marital status: Married    Spouse name: Suezanne Jacquet  . Number of children: 1  . Years of education: 39  . Highest education level: Master's degree (e.g., MA, MS, MEng, MEd, MSW, MBA)  Occupational History  . Occupation: Education officer, museum    Comment: retired  Tobacco Use  . Smoking status: Never Smoker  . Smokeless tobacco: Never Used  Substance and Sexual Activity  . Alcohol use: No  . Drug use: No  . Sexual activity: Not Currently  Other Topics Concern  . Not on file  Social History Narrative   Walks every day. Knit, sews and crochets.   Social Determinants of Health   Financial Resource Strain:   . Difficulty of Paying Living Expenses: Not on file  Food Insecurity:   .  Worried About Charity fundraiser in the Last Year: Not on file  . Ran Out of Food in the Last Year: Not on file  Transportation Needs:   . Lack of Transportation (Medical): Not on file  . Lack of Transportation (Non-Medical): Not on file  Physical Activity:   . Days of Exercise per Week: Not on file  . Minutes of Exercise per Session: Not on file  Stress:   . Feeling of Stress : Not on file  Social Connections:   . Frequency of Communication with Friends and Family: Not on file  . Frequency of Social Gatherings with Friends and Family: Not on file  . Attends Religious Services: Not on file  . Active Member of Clubs or Organizations: Not on file  . Attends Archivist Meetings: Not on file  . Marital Status: Not on file    Tobacco  Counseling Counseling given: No   Clinical Intake:  Pre-visit preparation completed: Yes  Pain : 0-10 Pain Type: Chronic pain Pain Location: Back Pain Orientation: Mid Pain Descriptors / Indicators: Aching, Throbbing, Constant Pain Onset: More than a month ago Pain Frequency: Intermittent Pain Relieving Factors: injections  Pain Relieving Factors: injections  Nutritional Risks: None Diabetes: No  How often do you need to have someone help you when you read instructions, pamphlets, or other written materials from your doctor or pharmacy?: 1 - Never What is the last grade level you completed in school?: 18  Interpreter Needed?: No  Information entered by :: Orlie Dakin, LPN   Activities of Daily Living In your present state of health, do you have any difficulty performing the following activities: 10/27/2019  Hearing? N  Vision? N  Difficulty concentrating or making decisions? N  Walking or climbing stairs? N  Dressing or bathing? N  Doing errands, shopping? N  Preparing Food and eating ? N  Using the Toilet? N  In the past six months, have you accidently leaked urine? N  Do you have problems with loss of bowel control? N  Managing your Medications? N  Managing your Finances? N  Housekeeping or managing your Housekeeping? N  Some recent data might be hidden     Immunizations and Health Maintenance Immunization History  Administered Date(s) Administered  . Fluad Quad(high Dose 65+) 08/03/2019  . Influenza Whole 08/24/2010  . Influenza, High Dose Seasonal PF 09/04/2018  . Pneumococcal Polysaccharide-23 06/22/2009  . Td 06/22/2009   Health Maintenance Due  Topic Date Due  . PNA vac Low Risk Adult (2 of 2 - PCV13) 06/22/2010  . TETANUS/TDAP  06/23/2019    Patient Care Team: Hali Marry, MD as PCP - General  Indicate any recent Medical Services you may have received from other than [facility] providers in the past year (date may be approximate).      Assessment:   This is a routine wellness examination for [place].Physical assessment deferred to PCP.   Hearing/Vision screen  Hearing Screening   125Hz  250Hz  500Hz  1000Hz  2000Hz  3000Hz  4000Hz  6000Hz  8000Hz   Right ear:           Left ear:           Comments: Hearing test not done due to visit was via telephone due to [place] Comments: Vision test not done due to visit done via telephone due to [place] issues and exercise activities discussed: Current Exercise Habits: Home exercise routine, Type of exercise: walking, Time (Minutes): 30, Frequency (Times/Week): 5, Weekly Exercise (  Minutes/Week): 150, Intensity: Mild, Exercise limited by: None identified Diet Eats a healthy diet of fruits, vegetables and proteins Breakfast: Cereal and orange juice Lunch: Meat and vegetables, spaghetti and salad. Dinner:   [place] Curd with soup Drinks water daily 4-5 glasses a day Takes calcium supplement daily    Goals    . Weight (lb) < 200 lb (90.7 kg)     Loose 10 lbs this year.      Depression Screen PHQ 2/9 Scores 10/27/2019 03/28/2017  PHQ - 2 Score 0 0    Fall Risk Fall Risk  10/27/2019 10/27/2019 05/01/2019 04/30/2019 03/28/2017  Falls in the past year? 0 0 0 0 No  Number falls in past yr: - 0 0 0 -  Injury with Fall? - - 0 0 -  Risk for fall due to : No Fall Risks No Fall Risks - - -  Follow up Falls prevention discussed - - Falls evaluation completed -    Is the patient's home free of loose throw rugs in walkways, pet beds, electrical cords, etc?   yes      Grab bars in the bathroom? no      Handrails on the stairs?   no      Adequate lighting?   yes   Cognitive Function:     6CIT Screen 10/27/2019  What Year? 0 points  What month? 0 points  What time? 0 points  Count back from 20 0 points  Months in reverse 0 points  Repeat phrase 2 points  Total Score 2    Screening Tests Health Maintenance  Topic Date Due  . PNA vac Low Risk Adult (2 of 2 -  PCV13) 06/22/2010  . TETANUS/TDAP  06/23/2019  . INFLUENZA VACCINE  Completed  . DEXA SCAN  Completed     Plan:    Please schedule your next medicare wellness visit with me in 1 yr.  Ms. Mccully , Thank you for taking time to come for your Medicare Wellness Visit. I appreciate your ongoing commitment to your health goals. Please review the following plan we discussed and let me know if I can assist you in the future.  Continue doing brain stimulating activities (puzzles, reading, adult coloring books, staying active) to keep memory sharp.  Bring a copy of your living will and/or healthcare power of attorney to your next office visit.   These are the goals we discussed: Goals    . Weight (lb) < 200 lb (90.7 kg)     Loose 10 lbs this year.       This is a list of the screening recommended for you and due dates:  Health Maintenance  Topic Date Due  . Pneumonia vaccines (2 of 2 - PCV13) 06/22/2010  . Tetanus Vaccine  06/23/2019  . Flu Shot  Completed  . DEXA scan (bone density measurement)  Completed     I have personally reviewed and noted the following in the patient's chart:   . Medical and social history . Use of alcohol, tobacco or illicit drugs  . Current medications and supplements . Functional ability and status . Nutritional status . Physical activity . Advanced directives . List of other physicians . Hospitalizations, surgeries, and ER visits in previous 12 months . Vitals . Screenings to include cognitive, depression, and falls . Referrals and appointments  In addition, I have reviewed and discussed with patient certain preventive protocols, quality metrics, and best practice recommendations. A written personalized care plan for preventive services  as well as general preventive health recommendations were provided to patient.     Joanne Chars, LPN   [phone]

## 2019-11-03 DIAGNOSIS — G894 Chronic pain syndrome: Secondary | ICD-10-CM | POA: Diagnosis not present

## 2019-11-03 DIAGNOSIS — M47814 Spondylosis without myelopathy or radiculopathy, thoracic region: Secondary | ICD-10-CM | POA: Diagnosis not present

## 2019-11-03 DIAGNOSIS — M7918 Myalgia, other site: Secondary | ICD-10-CM | POA: Diagnosis not present

## 2019-11-03 DIAGNOSIS — M47816 Spondylosis without myelopathy or radiculopathy, lumbar region: Secondary | ICD-10-CM | POA: Diagnosis not present

## 2019-11-27 ENCOUNTER — Ambulatory Visit: Payer: Self-pay

## 2020-01-01 DIAGNOSIS — M7918 Myalgia, other site: Secondary | ICD-10-CM | POA: Diagnosis not present

## 2020-01-01 DIAGNOSIS — M47814 Spondylosis without myelopathy or radiculopathy, thoracic region: Secondary | ICD-10-CM | POA: Diagnosis not present

## 2020-01-01 DIAGNOSIS — G894 Chronic pain syndrome: Secondary | ICD-10-CM | POA: Diagnosis not present

## 2020-01-26 ENCOUNTER — Encounter: Payer: Self-pay | Admitting: Family Medicine

## 2020-01-27 ENCOUNTER — Other Ambulatory Visit: Payer: Self-pay

## 2020-01-27 DIAGNOSIS — M810 Age-related osteoporosis without current pathological fracture: Secondary | ICD-10-CM

## 2020-01-27 NOTE — Progress Notes (Signed)
Labs order for prolia injection.

## 2020-01-27 NOTE — Telephone Encounter (Signed)
Prior authorization required for Prolia injection.

## 2020-01-28 ENCOUNTER — Ambulatory Visit: Payer: Medicare HMO

## 2020-01-28 DIAGNOSIS — M810 Age-related osteoporosis without current pathological fracture: Secondary | ICD-10-CM | POA: Diagnosis not present

## 2020-01-29 LAB — BASIC METABOLIC PANEL WITH GFR
BUN/Creatinine Ratio: 25 (calc) — ABNORMAL HIGH (ref 6–22)
BUN: 23 mg/dL (ref 7–25)
CO2: 26 mmol/L (ref 20–32)
Calcium: 10.4 mg/dL (ref 8.6–10.4)
Chloride: 106 mmol/L (ref 98–110)
Creat: 0.92 mg/dL — ABNORMAL HIGH (ref 0.60–0.88)
GFR, Est African American: 68 mL/min/{1.73_m2} (ref >=60)
GFR, Est Non African American: 59 mL/min/{1.73_m2} — ABNORMAL LOW (ref >=60)
Glucose, Bld: 136 mg/dL — ABNORMAL HIGH (ref 65–99)
Potassium: 4.6 mmol/L (ref 3.5–5.3)
Sodium: 141 mmol/L (ref 135–146)

## 2020-01-29 NOTE — Progress Notes (Signed)
All labs are normal. 

## 2020-02-01 ENCOUNTER — Ambulatory Visit (INDEPENDENT_AMBULATORY_CARE_PROVIDER_SITE_OTHER): Payer: Medicare HMO | Admitting: Family Medicine

## 2020-02-01 ENCOUNTER — Ambulatory Visit: Payer: Medicare HMO | Admitting: Sports Medicine

## 2020-02-01 ENCOUNTER — Other Ambulatory Visit: Payer: Self-pay

## 2020-02-01 VITALS — BP 120/75 | HR 67 | Temp 98.0°F

## 2020-02-01 DIAGNOSIS — R7309 Other abnormal glucose: Secondary | ICD-10-CM | POA: Diagnosis not present

## 2020-02-01 DIAGNOSIS — M8000XA Age-related osteoporosis with current pathological fracture, unspecified site, initial encounter for fracture: Secondary | ICD-10-CM

## 2020-02-01 DIAGNOSIS — M810 Age-related osteoporosis without current pathological fracture: Secondary | ICD-10-CM

## 2020-02-01 LAB — POCT GLYCOSYLATED HEMOGLOBIN (HGB A1C): Hemoglobin A1C: 5.4 % (ref 4.0–5.6)

## 2020-02-01 MED ORDER — DENOSUMAB 60 MG/ML ~~LOC~~ SOSY
60.0000 mg | PREFILLED_SYRINGE | Freq: Once | SUBCUTANEOUS | Status: AC
  Administered 2020-02-01: 15:00:00 60 mg via SUBCUTANEOUS

## 2020-02-01 NOTE — Telephone Encounter (Signed)
Approved - [id number] 02/01/2020-10/14/2020

## 2020-02-01 NOTE — Progress Notes (Signed)
Pt is here for a Prolia injection. Pt reports taking calcium and vitamin D daily. Pt's calcium levels and kidney functions was within normal limits. Per medical assistant, POCT A1c was completed since glucose levels was out of range. A1c results were 5.4%. Pt tolerated injection well without complications on Right arm. Pt advised to schedule next injection in 6 months.

## 2020-02-01 NOTE — Progress Notes (Signed)
Agree with documentation as above.   Lelani Garnett, MD  

## 2020-07-15 ENCOUNTER — Other Ambulatory Visit: Payer: Self-pay

## 2020-07-15 DIAGNOSIS — M8000XA Age-related osteoporosis with current pathological fracture, unspecified site, initial encounter for fracture: Secondary | ICD-10-CM

## 2020-07-15 DIAGNOSIS — M810 Age-related osteoporosis without current pathological fracture: Secondary | ICD-10-CM

## 2020-07-15 NOTE — Progress Notes (Signed)
Ordered CMP for Prolia injection.

## 2020-07-27 DIAGNOSIS — M810 Age-related osteoporosis without current pathological fracture: Secondary | ICD-10-CM | POA: Diagnosis not present

## 2020-07-28 LAB — COMPLETE METABOLIC PANEL WITH GFR
AG Ratio: 1.6 (calc) (ref 1.0–2.5)
ALT: 10 U/L (ref 6–29)
AST: 17 U/L (ref 10–35)
Albumin: 4 g/dL (ref 3.6–5.1)
Alkaline phosphatase (APISO): 47 U/L (ref 37–153)
BUN/Creatinine Ratio: 21 (calc) (ref 6–22)
BUN: 19 mg/dL (ref 7–25)
CO2: 24 mmol/L (ref 20–32)
Calcium: 9.7 mg/dL (ref 8.6–10.4)
Chloride: 108 mmol/L (ref 98–110)
Creat: 0.9 mg/dL — ABNORMAL HIGH (ref 0.60–0.88)
GFR, Est African American: 70 mL/min/{1.73_m2} (ref >=60)
GFR, Est Non African American: 60 mL/min/{1.73_m2} (ref >=60)
Globulin: 2.5 g/dL (calc) (ref 1.9–3.7)
Glucose, Bld: 80 mg/dL (ref 65–139)
Potassium: 3.9 mmol/L (ref 3.5–5.3)
Sodium: 141 mmol/L (ref 135–146)
Total Bilirubin: 0.6 mg/dL (ref 0.2–1.2)
Total Protein: 6.5 g/dL (ref 6.1–8.1)

## 2020-07-28 NOTE — Progress Notes (Signed)
All labs are normal. 

## 2020-08-04 ENCOUNTER — Ambulatory Visit (INDEPENDENT_AMBULATORY_CARE_PROVIDER_SITE_OTHER): Payer: Medicare HMO | Admitting: Family Medicine

## 2020-08-04 ENCOUNTER — Ambulatory Visit: Payer: Medicare HMO

## 2020-08-04 VITALS — BP 115/66 | HR 70 | Temp 97.7°F | Wt 146.0 lb

## 2020-08-04 DIAGNOSIS — Z23 Encounter for immunization: Secondary | ICD-10-CM

## 2020-08-04 DIAGNOSIS — M810 Age-related osteoporosis without current pathological fracture: Secondary | ICD-10-CM | POA: Diagnosis not present

## 2020-08-04 MED ORDER — DENOSUMAB 60 MG/ML ~~LOC~~ SOSY
60.0000 mg | PREFILLED_SYRINGE | Freq: Once | SUBCUTANEOUS | Status: AC
  Administered 2020-08-04: 60 mg via SUBCUTANEOUS

## 2020-08-04 NOTE — Progress Notes (Signed)
Agree with documentation as above.   Makinna Andy, MD  

## 2020-08-04 NOTE — Progress Notes (Signed)
° °  Subjective:    Patient ID: Rhonda Scott, female    DOB: 13-Jun-1941, 79 y.o.   MRN: [id number]  HPI Patient here for a Prolia injection.  Patient reports taking calcium and vitamin D daily.  Patient's calcium levels and kidney function was reviewed by PCP.     Review of Systems     Objective:   Physical Exam        Assessment & Plan:  Patient tolerated injections well without complications.  Patient advised to schedule next injection in 6 months.

## 2021-01-23 ENCOUNTER — Encounter: Payer: Self-pay | Admitting: Family Medicine

## 2021-01-26 ENCOUNTER — Telehealth: Payer: Self-pay

## 2021-01-26 DIAGNOSIS — R7309 Other abnormal glucose: Secondary | ICD-10-CM | POA: Diagnosis not present

## 2021-01-26 DIAGNOSIS — M810 Age-related osteoporosis without current pathological fracture: Secondary | ICD-10-CM

## 2021-01-26 DIAGNOSIS — R7989 Other specified abnormal findings of blood chemistry: Secondary | ICD-10-CM

## 2021-01-26 DIAGNOSIS — E038 Other specified hypothyroidism: Secondary | ICD-10-CM

## 2021-01-26 DIAGNOSIS — E559 Vitamin D deficiency, unspecified: Secondary | ICD-10-CM | POA: Diagnosis not present

## 2021-01-26 NOTE — Telephone Encounter (Signed)
Ordered labs

## 2021-01-31 NOTE — Telephone Encounter (Signed)
We can switch her to the IV infusion of cZolendric acid once a year if she would like.  We will have to find out if we can order this at [facility] through the day center.  There might be orders that we can just sign and fax to them and they can get her scheduled I am not sure.  Honestly I have not ordered this in a long time.

## 2021-02-01 NOTE — Telephone Encounter (Addendum)
Rhonda Scott had recent labs. Is it ok for a Prolia injection?  Prior Authorization for Prolia has been extended.   02/01/2020-10/14/21 Authorization number - [id number]

## 2021-02-01 NOTE — Telephone Encounter (Signed)
Patient scheduled.

## 2021-02-01 NOTE — Telephone Encounter (Signed)
Yes, okay for Prolia injection every 6 months.

## 2021-02-02 ENCOUNTER — Ambulatory Visit (INDEPENDENT_AMBULATORY_CARE_PROVIDER_SITE_OTHER): Payer: Medicare HMO | Admitting: Family Medicine

## 2021-02-02 VITALS — BP 137/64 | HR 54 | Temp 98.0°F | Resp 20 | Ht 63.0 in | Wt 146.5 lb

## 2021-02-02 DIAGNOSIS — M810 Age-related osteoporosis without current pathological fracture: Secondary | ICD-10-CM

## 2021-02-02 LAB — COMPLETE METABOLIC PANEL WITH GFR
AG Ratio: 1.6 (calc) (ref 1.0–2.5)
ALT: 8 U/L (ref 6–29)
AST: 15 U/L (ref 10–35)
Albumin: 4 g/dL (ref 3.6–5.1)
Alkaline phosphatase (APISO): 43 U/L (ref 37–153)
BUN/Creatinine Ratio: 23 (calc) — ABNORMAL HIGH (ref 6–22)
BUN: 21 mg/dL (ref 7–25)
CO2: 25 mmol/L (ref 20–32)
Calcium: 9.4 mg/dL (ref 8.6–10.4)
Chloride: 108 mmol/L (ref 98–110)
Creat: 0.93 mg/dL — ABNORMAL HIGH (ref 0.60–0.88)
GFR, Est African American: 67 mL/min/{1.73_m2} (ref >=60)
GFR, Est Non African American: 58 mL/min/{1.73_m2} — ABNORMAL LOW (ref >=60)
Globulin: 2.5 g/dL (calc) (ref 1.9–3.7)
Glucose, Bld: 121 mg/dL — ABNORMAL HIGH (ref 65–99)
Potassium: 3.9 mmol/L (ref 3.5–5.3)
Sodium: 142 mmol/L (ref 135–146)
Total Bilirubin: 0.6 mg/dL (ref 0.2–1.2)
Total Protein: 6.5 g/dL (ref 6.1–8.1)

## 2021-02-02 LAB — CBC WITH DIFFERENTIAL/PLATELET
Absolute Monocytes: 523 cells/uL (ref 200–950)
Basophils Absolute: 42 cells/uL (ref 0–200)
Basophils Relative: 0.5 %
Eosinophils Absolute: 83 cells/uL (ref 15–500)
Eosinophils Relative: 1 %
HCT: 41 % (ref 35.0–45.0)
Hemoglobin: 13.3 g/dL (ref 11.7–15.5)
Lymphs Abs: 2498 cells/uL (ref 850–3900)
MCH: 30.9 pg (ref 27.0–33.0)
MCHC: 32.4 g/dL (ref 32.0–36.0)
MCV: 95.1 fL (ref 80.0–100.0)
MPV: 10.2 fL (ref 7.5–12.5)
Monocytes Relative: 6.3 %
Neutro Abs: 5154 cells/uL (ref 1500–7800)
Neutrophils Relative %: 62.1 %
Platelets: 319 10*3/uL (ref 140–400)
RBC: 4.31 10*6/uL (ref 3.80–5.10)
RDW: 12.6 % (ref 11.0–15.0)
Total Lymphocyte: 30.1 %
WBC: 8.3 10*3/uL (ref 3.8–10.8)

## 2021-02-02 LAB — TSH: TSH: 5.88 mIU/L — ABNORMAL HIGH (ref 0.40–4.50)

## 2021-02-02 LAB — HEMOGLOBIN A1C
Hgb A1c MFr Bld: 5.4 % of total Hgb (ref <5.7)
Mean Plasma Glucose: 108 mg/dL
eAG (mmol/L): 6 mmol/L

## 2021-02-02 LAB — T4, FREE

## 2021-02-02 LAB — VITAMIN D 25 HYDROXY (VIT D DEFICIENCY, FRACTURES): Vit D, 25-Hydroxy: 33 ng/mL (ref 30–100)

## 2021-02-02 MED ORDER — DENOSUMAB 60 MG/ML ~~LOC~~ SOSY
60.0000 mg | PREFILLED_SYRINGE | Freq: Once | SUBCUTANEOUS | Status: AC
  Administered 2021-02-02: 60 mg via SUBCUTANEOUS

## 2021-02-02 NOTE — Progress Notes (Signed)
Established Patient Office Visit  Subjective:  Patient ID: Rhonda Scott, female    DOB: 01-30-1941  Age: 80 y.o. MRN: [id number]  CC:  Chief Complaint  Patient presents with  . Osteoporosis    HPI Armentha Branagan presents for a Prolia injection. Pt reports taking calcium and vitamin D daily. Pt's calcium levels and kidney function was within normal limits. Prolia injected into right arm. Pt tolerated well without any apparent complications. Pt will schedule an appointment for next injection in 6 months.  Past Medical History:  Diagnosis Date  . DDD (degenerative disc disease), lumbar    Dr. Francesco Runner    Past Surgical History:  Procedure Laterality Date  . APPENDECTOMY  1968  . KNEE SURGERY  1979    Family History  Problem Relation Age of Onset  . Hypothyroidism Sister   . Hypothyroidism Sister     Social History   Socioeconomic History  . Marital status: Married    Spouse name: Suezanne Jacquet  . Number of children: 1  . Years of education: 58  . Highest education level: Master's degree (e.g., MA, MS, MEng, MEd, MSW, MBA)  Occupational History  . Occupation: Education officer, museum    Comment: retired  Tobacco Use  . Smoking status: Never Smoker  . Smokeless tobacco: Never Used  Vaping Use  . Vaping Use: Never used  Substance and Sexual Activity  . Alcohol use: No  . Drug use: No  . Sexual activity: Not Currently  Other Topics Concern  . Not on file  Social History Narrative   Walks every day. Knit, sews and crochets.   Social Determinants of Health   Financial Resource Strain: Not on file  Food Insecurity: Not on file  Transportation Needs: Not on file  Physical Activity: Not on file  Stress: Not on file  Social Connections: Not on file  Intimate Partner Violence: Not on file    Outpatient Medications Prior to Visit  Medication Sig Dispense Refill  . acetaminophen (TYLENOL) 325 MG tablet Take by mouth.    . Calcium Carbonate (CALCIUM 600 PO) Take 600 mg by  mouth 2 (two) times daily.    . Cholecalciferol (VITAMIN D3) 50 MCG (2000 UT) TABS Take by mouth.     No facility-administered medications prior to visit.    Allergies  Allergen Reactions  . Vancomycin Hives    ROS Review of Systems    Objective:    Physical Exam  BP 137/64 (BP Location: Left Arm, Patient Position: Sitting, Cuff Size: Normal)   Pulse (!) 54   Temp 98 F (36.7 C) (Oral)   Resp 20   Ht 5\' 3"  (1.6 m)   Wt 146 lb 8 oz (66.5 kg)   SpO2 99%   BMI 25.95 kg/m  Wt Readings from Last 3 Encounters:  02/02/21 146 lb 8 oz (66.5 kg)  08/04/20 146 lb (66.2 kg)  10/27/19 142 lb (64.4 kg)     Health Maintenance Due  Topic Date Due  . PNA vac Low Risk Adult (2 of 2 - PCV13) 06/22/2010  . TETANUS/TDAP  06/23/2019  . COVID-19 Vaccine (3 - Pfizer risk 4-dose series) 01/01/2020    There are no preventive care reminders to display for this patient.  Lab Results  Component Value Date   TSH 5.88 (H) 01/26/2021   Lab Results  Component Value Date   WBC 8.3 01/26/2021   HGB 13.3 01/26/2021   HCT 41.0 01/26/2021   MCV 95.1 01/26/2021   PLT  319 01/26/2021   Lab Results  Component Value Date   NA 142 01/26/2021   K 3.9 01/26/2021   CO2 25 01/26/2021   GLUCOSE 121 (H) 01/26/2021   BUN 21 01/26/2021   CREATININE 0.93 (H) 01/26/2021   BILITOT 0.6 01/26/2021   ALKPHOS 73 04/10/2019   AST 15 01/26/2021   ALT 8 01/26/2021   PROT 6.5 01/26/2021   ALBUMIN 4.5 09/04/2010   CALCIUM 9.4 01/26/2021   Lab Results  Component Value Date   CHOL 229 (H) 09/04/2010   Lab Results  Component Value Date   HDL 90 09/04/2010   Lab Results  Component Value Date   LDLCALC 120 (H) 09/04/2010   Lab Results  Component Value Date   TRIG 97 09/04/2010   Lab Results  Component Value Date   CHOLHDL 2.5 Ratio 09/04/2010   Lab Results  Component Value Date   HGBA1C 5.4 01/26/2021      Assessment & Plan:  Prolia injected into right arm. Pt tolerated well without  any apparent complications. Pt will schedule an appointment for next injection in 6 months. Problem List Items Addressed This Visit      Musculoskeletal and Integument   Osteoporosis - Primary   Relevant Medications   denosumab (PROLIA) injection 60 mg (Start on 02/02/2021  9:00 AM)      Meds ordered this encounter  Medications  . denosumab (PROLIA) injection 60 mg    Follow-up: Return in about 6 months (around 08/04/2021) for Prolia Injection.    Ninfa Meeker, CMA

## 2021-02-02 NOTE — Progress Notes (Signed)
Agree with documentation as above.   Denzel Etienne, MD  

## 2021-03-17 ENCOUNTER — Ambulatory Visit (INDEPENDENT_AMBULATORY_CARE_PROVIDER_SITE_OTHER): Payer: Medicare HMO | Admitting: Family Medicine

## 2021-03-17 ENCOUNTER — Other Ambulatory Visit: Payer: Self-pay

## 2021-03-17 ENCOUNTER — Encounter: Payer: Self-pay | Admitting: Family Medicine

## 2021-03-17 VITALS — BP 132/62 | HR 55 | Ht 63.0 in | Wt 143.0 lb

## 2021-03-17 DIAGNOSIS — R21 Rash and other nonspecific skin eruption: Secondary | ICD-10-CM

## 2021-03-17 MED ORDER — PREDNISONE 20 MG PO TABS: ORAL_TABLET | ORAL | 0 refills | Status: AC

## 2021-03-17 NOTE — Progress Notes (Signed)
Pt reports that on Wednesday she began to break out with a rash on her chin, around her cheeks causing her eyes to get puffy and swell almost shut, it also spread down her neck to upper chest. She tried using some Calamine lotion but it burned so she washed it off and used some facial moisturizer. She states that the burning was worse on yesterday. She took an IBU last night to help her to rest.   No changes in her diet,lotions,soaps,detergents,ect. She hasn't been working out in her yard and doesn't remember touching anything from outdoors and touching her face.

## 2021-03-17 NOTE — Progress Notes (Signed)
Acute Office Visit  Subjective:    Patient ID: Rhonda Scott, female    DOB: [birth year], 80 y.o.   MRN: [id number]  Chief Complaint  Patient presents with  . Rash    HPI Patient is in today for rash - reports on Wednesday she began to break out with a rash on her chin, around her cheeks causing her eyes to get puffy and swell almost shut, it also spread down her neck to upper chest. She tried using some Calamine lotion but it burned so she washed it off and used some facial moisturizer. She states that the burning was worse on yesterday. She took an IBU last night to help her to rest.  He says both eyes were almost swollen shut this morning but has actually gotten a little bit better.  No changes in her diet,lotions,soaps,detergents,ect. She hasn't been working out in her yard and doesn't remember touching anything from outdoors and touching her face.   Past Medical History:  Diagnosis Date  . DDD (degenerative disc disease), lumbar    Dr. Francesco Runner    Past Surgical History:  Procedure Laterality Date  . APPENDECTOMY  1968  . KNEE SURGERY  1979    Family History  Problem Relation Age of Onset  . Hypothyroidism Sister   . Hypothyroidism Sister     Social History   Socioeconomic History  . Marital status: Married    Spouse name: Suezanne Jacquet  . Number of children: 1  . Years of education: 52  . Highest education level: Master's degree (e.g., MA, MS, MEng, MEd, MSW, MBA)  Occupational History  . Occupation: Education officer, museum    Comment: retired  Tobacco Use  . Smoking status: Never Smoker  . Smokeless tobacco: Never Used  Vaping Use  . Vaping Use: Never used  Substance and Sexual Activity  . Alcohol use: No  . Drug use: No  . Sexual activity: Not Currently  Other Topics Concern  . Not on file  Social History Narrative   Walks every day. Knit, sews and crochets.   Social Determinants of Health   Financial Resource Strain: Not on file  Food Insecurity: Not on file   Transportation Needs: Not on file  Physical Activity: Not on file  Stress: Not on file  Social Connections: Not on file  Intimate Partner Violence: Not on file    Outpatient Medications Prior to Visit  Medication Sig Dispense Refill  . acetaminophen (TYLENOL) 325 MG tablet Take by mouth.    . Calcium Carbonate (CALCIUM 600 PO) Take 600 mg by mouth 2 (two) times daily.    . Cholecalciferol (VITAMIN D3) 50 MCG (2000 UT) TABS Take by mouth.     No facility-administered medications prior to visit.    Allergies  Allergen Reactions  . Vancomycin Hives    Review of Systems     Objective:    Physical Exam Vitals reviewed.  Constitutional:      Appearance: She is well-developed.  HENT:     Head: Normocephalic and atraumatic.  Eyes:     Conjunctiva/sclera: Conjunctivae normal.  Cardiovascular:     Rate and Rhythm: Normal rate.  Pulmonary:     Effort: Pulmonary effort is normal.  Skin:    General: Skin is dry.     Coloration: Skin is not pale.     Comments: Thematous maculopapular lesions on her forehead on both sides of the nasal bridge, on her chin on the right side of her neck and on  her right upper chest.  No discrete vesicles.  She does have a fluid-filled pouch underneath her right eye and a little bit of swelling around her right eye.  Neurological:     Mental Status: She is alert and oriented to person, place, and time.  Psychiatric:        Behavior: Behavior normal.         BP 132/62   Pulse (!) 55   Ht 5\' 3"  (1.6 m)   Wt 143 lb (64.9 kg)   SpO2 99%   BMI 25.33 kg/m  Wt Readings from Last 3 Encounters:  03/17/21 143 lb (64.9 kg)  02/02/21 146 lb 8 oz (66.5 kg)  08/04/20 146 lb (66.2 kg)    Health Maintenance Due  Topic Date Due  . Pneumococcal Vaccine 51-52 Years old (1 of 4 - PCV13) Never done  . Zoster Vaccines- Shingrix (1 of 2) Never done  . PNA vac Low Risk Adult (2 of 2 - PCV13) 06/22/2010  . TETANUS/TDAP  06/23/2019  . COVID-19 Vaccine (3  - Pfizer risk 4-dose series) 01/01/2020    There are no preventive care reminders to display for this patient.   Lab Results  Component Value Date   TSH 5.88 (H) 01/26/2021   Lab Results  Component Value Date   WBC 8.3 01/26/2021   HGB 13.3 01/26/2021   HCT 41.0 01/26/2021   MCV 95.1 01/26/2021   PLT 319 01/26/2021   Lab Results  Component Value Date   NA 142 01/26/2021   K 3.9 01/26/2021   CO2 25 01/26/2021   GLUCOSE 121 (H) 01/26/2021   BUN 21 01/26/2021   CREATININE 0.93 (H) 01/26/2021   BILITOT 0.6 01/26/2021   ALKPHOS 73 04/10/2019   AST 15 01/26/2021   ALT 8 01/26/2021   PROT 6.5 01/26/2021   ALBUMIN 4.5 09/04/2010   CALCIUM 9.4 01/26/2021   Lab Results  Component Value Date   CHOL 229 (H) 09/04/2010   Lab Results  Component Value Date   HDL 90 09/04/2010   Lab Results  Component Value Date   LDLCALC 120 (H) 09/04/2010   Lab Results  Component Value Date   TRIG 97 09/04/2010   Lab Results  Component Value Date   CHOLHDL 2.5 Ratio 09/04/2010   Lab Results  Component Value Date   HGBA1C 5.4 01/26/2021       Assessment & Plan:   Problem List Items Addressed This Visit   None   Visit Diagnoses    Rash    -  Primary     Unclear etiology. Goes from forehead to her upper chest so not likely shingles. Recommend cold compresses rec oral antihistamine. Will start prednisone. Warned about potential SE.    Meds ordered this encounter  Medications  . predniSONE (DELTASONE) 20 MG tablet    Sig: Take 2 tablets (40 mg total) by mouth daily with breakfast for 5 days, THEN 1 tablet (20 mg total) daily with breakfast for 5 days.    Dispense:  15 tablet    Refill:  0     Beatrice Lecher, MD

## 2021-06-16 DIAGNOSIS — C44311 Basal cell carcinoma of skin of nose: Secondary | ICD-10-CM | POA: Diagnosis not present

## 2021-06-16 DIAGNOSIS — L728 Other follicular cysts of the skin and subcutaneous tissue: Secondary | ICD-10-CM | POA: Diagnosis not present

## 2021-06-29 ENCOUNTER — Telehealth: Payer: Self-pay | Admitting: Family Medicine

## 2021-06-29 NOTE — Telephone Encounter (Signed)
Spoke with patient to schedule Medicare Annual Wellness Visit (AWV) either virtually or in office.   Last AWV I 10/27/19 ; please schedule at anytime with health coach  Patient declined did think she needed

## 2021-07-10 DIAGNOSIS — C44311 Basal cell carcinoma of skin of nose: Secondary | ICD-10-CM | POA: Diagnosis not present

## 2021-07-28 ENCOUNTER — Other Ambulatory Visit: Payer: Self-pay | Admitting: *Deleted

## 2021-07-28 DIAGNOSIS — M810 Age-related osteoporosis without current pathological fracture: Secondary | ICD-10-CM | POA: Diagnosis not present

## 2021-07-28 DIAGNOSIS — Z85828 Personal history of other malignant neoplasm of skin: Secondary | ICD-10-CM | POA: Diagnosis not present

## 2021-07-29 LAB — BASIC METABOLIC PANEL WITH GFR
BUN: 22 mg/dL (ref 7–25)
CO2: 25 mmol/L (ref 20–32)
Calcium: 9.8 mg/dL (ref 8.6–10.4)
Chloride: 105 mmol/L (ref 98–110)
Creat: 0.86 mg/dL (ref 0.60–0.95)
Glucose, Bld: 122 mg/dL (ref 65–139)
Potassium: 4 mmol/L (ref 3.5–5.3)
Sodium: 139 mmol/L (ref 135–146)
eGFR: 68 mL/min/{1.73_m2} (ref >=60)

## 2021-07-29 LAB — CBC
HCT: 40.9 % (ref 35.0–45.0)
Hemoglobin: 13.3 g/dL (ref 11.7–15.5)
MCH: 31.4 pg (ref 27.0–33.0)
MCHC: 32.5 g/dL (ref 32.0–36.0)
MCV: 96.5 fL (ref 80.0–100.0)
MPV: 9.7 fL (ref 7.5–12.5)
Platelets: 336 10*3/uL (ref 140–400)
RBC: 4.24 10*6/uL (ref 3.80–5.10)
RDW: 12.4 % (ref 11.0–15.0)
WBC: 8.2 10*3/uL (ref 3.8–10.8)

## 2021-07-29 NOTE — Progress Notes (Signed)
Your lab work is within acceptable range and there are no concerning findings.   ?

## 2021-08-04 ENCOUNTER — Ambulatory Visit (INDEPENDENT_AMBULATORY_CARE_PROVIDER_SITE_OTHER): Payer: Medicare HMO | Admitting: Family Medicine

## 2021-08-04 ENCOUNTER — Other Ambulatory Visit: Payer: Self-pay

## 2021-08-04 VITALS — BP 132/56 | HR 63 | Ht 64.0 in | Wt 143.1 lb

## 2021-08-04 VITALS — BP 132/56 | HR 63

## 2021-08-04 DIAGNOSIS — Z0000 Encounter for general adult medical examination without abnormal findings: Secondary | ICD-10-CM

## 2021-08-04 DIAGNOSIS — M810 Age-related osteoporosis without current pathological fracture: Secondary | ICD-10-CM

## 2021-08-04 DIAGNOSIS — Z23 Encounter for immunization: Secondary | ICD-10-CM

## 2021-08-04 MED ORDER — DENOSUMAB 60 MG/ML ~~LOC~~ SOSY
60.0000 mg | PREFILLED_SYRINGE | Freq: Once | SUBCUTANEOUS | Status: AC
  Administered 2021-08-04: 60 mg via SUBCUTANEOUS

## 2021-08-04 NOTE — Progress Notes (Signed)
Agree with documentation as above.   Rhonda Gellner, MD  

## 2021-08-04 NOTE — Progress Notes (Signed)
MEDICARE ANNUAL WELLNESS VISIT  08/04/2021  Subjective:  Rhonda Scott is a 80 y.o. female patient of Metheney, Rhonda Kocher, MD who had a Medicare Annual Wellness Visit today. Rhonda Scott is Retired and lives with their spouse. she has 1 child. she reports that she is socially active and does interact with friends/family regularly. she is minimally physically active and enjoys knitting, sewing and quilting.  Patient Care Team: Hali Marry, MD as PCP - General  Advanced Directives 08/04/2021 10/27/2019  Does Patient Have a Medical Advance Directive? Yes Yes  Type of Advance Directive Living will;Healthcare Power of [place];Living will  Does patient want to make changes to medical advance directive? No - Patient declined No - Patient declined  Copy of [place] in Chart? No - copy requested No - copy requested    Hospital Utilization Over the Past 12 Months: # of hospitalizations or ER visits: 0 # of surgeries: 0  Review of Systems    Patient reports that her overall health is unchanged when compared to last year.  Review of Systems: History obtained from chart review and the patient  All other systems negative.  Pain Assessment Pain : No/denies pain     Current Medications & Allergies (verified) Allergies as of 08/04/2021       Reactions   Vancomycin Hives        Medication List        Accurate as of August 04, 2021  8:53 AM. If you have any questions, ask your nurse or doctor.          acetaminophen 325 MG tablet Commonly known as: TYLENOL Take by mouth.   CALCIUM 600 PO Take 600 mg by mouth 2 (two) times daily.   Vitamin D3 50 MCG (2000 UT) Tabs Take by mouth.        History (reviewed): Past Medical History:  Diagnosis Date   DDD (degenerative disc disease), lumbar    Dr. Francesco Runner   Past Surgical History:  Procedure Laterality Date   [place]    Family History  Problem Relation Age of Onset   Hypothyroidism Sister    Hypothyroidism Sister    Social History   Socioeconomic History   Marital status: Married    Spouse name: Ben   Number of children: 1   Years of education: 18   Highest education level: Master's degree (e.g., MA, MS, MEng, MEd, MSW, MBA)  Occupational History   Occupation: school Pharmacist, hospital    Comment: retired  Tobacco Use   Smoking status: Never   Smokeless tobacco: Never  Vaping Use   Vaping Use: Never used  Substance and Sexual Activity   Alcohol use: No   Drug use: No   Sexual activity: Not Currently  Other Topics Concern   Not on file  Social History Narrative   Lives with her husband. She has one child. She enjoys Firefighter, sewing and quliting.   Social Determinants of Health   Financial Resource Strain: Not on file  Food Insecurity: Not on file  Transportation Needs: Not on file  Physical Activity: Not on file  Stress: Not on file  Social Connections: Not on file    Activities of Daily Living In your present state of health, do you have any difficulty performing the following activities: 08/04/2021 02/02/2021  Hearing? N N  Vision? N N  Difficulty concentrating or making decisions? N N  Walking or climbing  stairs? N N  Dressing or bathing? N N  Doing errands, shopping? N N  Preparing Food and eating ? N -  Using the Toilet? N -  In the past six months, have you accidently leaked urine? Y -  Comment sometimes. -  Do you have problems with loss of bowel control? N -  Managing your Medications? N -  Managing your Finances? N -  Housekeeping or managing your Housekeeping? N -  Some recent data might be hidden    Patient Education/Literacy How often do you need to have someone help you when you read instructions, pamphlets, or other written materials from your doctor or pharmacy?: 1 - Never What is the last grade level you completed in school?: Masters degree  Exercise Current  Exercise Habits: Home exercise routine, Type of exercise: walking, Time (Minutes): 30, Frequency (Times/Week): 5, Weekly Exercise (Minutes/Week): 150, Intensity: Mild, Exercise limited by: None identified  Diet Patient reports consuming 3 meals a day and 2 snack(s) a day Patient reports that her primary diet is: Regular Patient reports that she does have regular access to food.   Depression Screen PHQ 2/9 Scores 08/04/2021 03/17/2021 10/27/2019 03/28/2017  PHQ - 2 Score 0 0 0 0     Fall Risk Fall Risk  08/04/2021 03/17/2021 10/27/2019 10/27/2019 05/01/2019  Falls in the past year? 0 0 0 0 0  Number falls in past yr: 0 0 - 0 0  Injury with Fall? 0 0 - - 0  Risk for fall due to : No Fall Risks No Fall Risks No Fall Risks No Fall Risks -  Follow up Falls evaluation completed Falls prevention discussed;Falls evaluation completed Falls prevention discussed - -     Objective:   BP (!) 132/56   Pulse 63   Ht 5\' 4"  (1.626 m)   Wt 143 lb 1.9 oz (64.9 kg)   SpO2 100%   BMI 24.57 kg/m   Last Weight  Most recent update: 08/04/2021  8:32 AM    Weight  64.9 kg (143 lb 1.9 oz)             Body mass index is 24.57 kg/m.  Hearing/Vision  Rhonda Scott did not have difficulty with hearing/understanding during the face-to-face interview Rhonda Scott did not have difficulty with her vision during the face-to-face interview Reports that she has not had a formal eye exam by an eye care professional within the past year Reports that she has not had a formal hearing evaluation within the past year  Cognitive Function: 6CIT Screen 08/04/2021 10/27/2019  What Year? 0 points 0 points  What month? 0 points 0 points  What time? 0 points 0 points  Count back from 20 0 points 0 points  Months in reverse 0 points 0 points  Repeat phrase 0 points 2 points  Total Score 0 2    Normal Cognitive Function Screening: Yes (Normal:0-7, Significant for Dysfunction: >8)  Immunization & Health Maintenance  Record Immunization History  Administered Date(s) Administered   Fluad Quad(high Dose 65+) 08/03/2019, 08/04/2020   Influenza Whole 08/24/2010   Influenza, High Dose Seasonal PF 09/04/2018, 08/04/2021   PFIZER(Purple Top)SARS-COV-2 Vaccination 11/12/2019, 12/04/2019, 07/22/2020   Pneumococcal Polysaccharide-23 06/22/2009   Td 06/22/2009    Health Maintenance  Topic Date Due   COVID-19 Vaccine (4 - Booster for [place] series) 08/20/2021 (Originally 09/16/2020)   Zoster Vaccines- Shingrix (1 of 2) 11/04/2021 (Originally 09/15/1960)   Pneumonia Vaccine 67+ Years old (2 - PCV) 01/12/2022 (Originally 06/22/2010)  TETANUS/TDAP  08/04/2022 (Originally 06/23/2019)   INFLUENZA VACCINE  Completed   DEXA SCAN  Completed   HPV VACCINES  Aged Out       Assessment  This is a routine wellness examination for Rhonda Scott.  Health Maintenance: Due or Overdue There are no preventive care reminders to display for this patient.   Rhonda Scott does not need a referral for Community Assistance: Care Management:   no Social Work:    no Prescription Assistance:  no Nutrition/Diabetes Education:  no   Plan:  Personalized Goals  Goals Addressed               This Visit's Progress     Patient Stated (pt-stated)        08/04/2021 AWV Goal: Exercise for General Health  Patient will verbalize understanding of the benefits of increased physical activity: Exercising regularly is important. It will improve your overall fitness, flexibility, and endurance. Regular exercise also will improve your overall health. It can help you control your weight, reduce stress, and improve your bone density. Over the next year, patient will increase physical activity as tolerated with a goal of at least 150 minutes of moderate physical activity per week.  You can tell that you are exercising at a moderate intensity if your heart starts beating faster and you start breathing faster but can still hold a  conversation. Moderate-intensity exercise ideas include: Walking 1 mile (1.6 km) in about 15 minutes Biking Hiking Golfing Dancing Water aerobics Patient will verbalize understanding of everyday activities that increase physical activity by providing examples like the following: Yard work, such as: Sales promotion account executive Gardening Washing windows or floors Patient will be able to explain general safety guidelines for exercising:  Before you start a new exercise program, talk with your health care provider. Do not exercise so much that you hurt yourself, feel dizzy, or get very short of breath. Wear comfortable clothes and wear shoes with good support. Drink plenty of water while you exercise to prevent dehydration or heat stroke. Work out until your breathing and your heartbeat get faster.        Personalized Health Maintenance & Screening Recommendations  Pneumococcal vaccine  Td vaccine Shingles vaccine  Lung Cancer Screening Recommended: no (Low Dose CT Chest recommended if Age 49-80 years, 30 pack-year currently smoking OR have quit w/in past 15 years) Hepatitis C Screening recommended: no HIV Screening recommended: no  Advanced Directives: Written information was not given per the patient's request.  Referrals & Orders No orders of the defined types were placed in this encounter.   Follow-up Plan Follow-up with Hali Marry, MD as planned Schedule your shingles and tetanus vaccine at your pharmacy.  Pneumonia vaccine can be done in office.  Medicare wellness visit in one year. AVS printed and given to the patient.   I have personally reviewed and noted the following in the patient's chart:   Medical and social history Use of alcohol, tobacco or illicit drugs  Current medications and supplements Functional ability and status Nutritional status Physical activity Advanced  directives List of other physicians Hospitalizations, surgeries, and ER visits in previous 12 months Vitals Screenings to include cognitive, depression, and falls Referrals and appointments  In addition, I have reviewed and discussed with patient certain preventive protocols, quality metrics, and best practice recommendations. A written personalized care plan for preventive services as well as general preventive health recommendations  were provided to patient.     Tinnie Gens, RN  08/04/2021

## 2021-08-04 NOTE — Patient Instructions (Signed)
[name] and Written Plan of Care  Ms. Rhonda Scott ,  Thank you for allowing me to perform your Medicare Annual Wellness Visit and for your ongoing commitment to your health.   Health Maintenance & Immunization History Health Maintenance  Topic Date Due   COVID-19 Vaccine (4 - Booster for Pfizer series) 08/20/2021 (Originally 09/16/2020)   Zoster Vaccines- Shingrix (1 of 2) 11/04/2021 (Originally 09/15/1960)   Pneumonia Vaccine 11+ Years old (2 - PCV) 01/12/2022 (Originally 06/22/2010)   TETANUS/TDAP  08/04/2022 (Originally 06/23/2019)   INFLUENZA VACCINE  Completed   DEXA SCAN  Completed   HPV VACCINES  Aged Out   Immunization History  Administered Date(s) Administered   Fluad Quad(high Dose 65+) 08/03/2019, 08/04/2020   Influenza Whole 08/24/2010   Influenza, High Dose Seasonal PF 09/04/2018, 08/04/2021   PFIZER(Purple Top)SARS-COV-2 Vaccination 11/12/2019, 12/04/2019, 07/22/2020   Pneumococcal Polysaccharide-23 06/22/2009   Td 06/22/2009    These are the patient goals that we discussed:  Goals Addressed               This Visit's Progress     Patient Stated (pt-stated)        08/04/2021 AWV Goal: Exercise for General Health  Patient will verbalize understanding of the benefits of increased physical activity: Exercising regularly is important. It will improve your overall fitness, flexibility, and endurance. Regular exercise also will improve your overall health. It can help you control your weight, reduce stress, and improve your bone density. Over the next year, patient will increase physical activity as tolerated with a goal of at least 150 minutes of moderate physical activity per week.  You can tell that you are exercising at a moderate intensity if your heart starts beating faster and you start breathing faster but can still hold a conversation. Moderate-intensity exercise ideas include: Walking 1 mile (1.6 km) in about  15 minutes Biking Hiking Golfing Dancing Water aerobics Patient will verbalize understanding of everyday activities that increase physical activity by providing examples like the following: Yard work, such as: Sales promotion account executive Gardening Washing windows or floors Patient will be able to explain general safety guidelines for exercising:  Before you start a new exercise program, talk with your health care provider. Do not exercise so much that you hurt yourself, feel dizzy, or get very short of breath. Wear comfortable clothes and wear shoes with good support. Drink plenty of water while you exercise to prevent dehydration or heat stroke. Work out until your breathing and your heartbeat get faster.          This is a list of Health Maintenance Items that are overdue or due now: Pneumococcal vaccine  Td vaccine Shingles vaccine    Orders/Referrals Placed Today: No orders of the defined types were placed in this encounter.  (Contact our referral department at [phone] if you have not spoken with someone about your referral appointment within the next 5 days)    Follow-up Plan Follow-up with Hali Marry, MD as planned Schedule your shingles and tetanus vaccine at your pharmacy.  Pneumonia vaccine can be done in office.  Medicare wellness visit in one year. AVS printed and given to the patient.     Health Maintenance, Female Adopting a healthy lifestyle and getting preventive care are important in promoting health and wellness. Ask your health care provider about: The right schedule for you to have regular tests  and exams. Things you can do on your own to prevent diseases and keep yourself healthy. What should I know about diet, weight, and exercise? Eat a healthy diet  Eat a diet that includes plenty of vegetables, fruits, low-fat dairy products, and lean protein. Do not eat  a lot of foods that are high in solid fats, added sugars, or sodium. Maintain a healthy weight Body mass index (BMI) is used to identify weight problems. It estimates body fat based on height and weight. Your health care provider can help determine your BMI and help you achieve or maintain a healthy weight. Get regular exercise Get regular exercise. This is one of the most important things you can do for your health. Most adults should: Exercise for at least 150 minutes each week. The exercise should increase your heart rate and make you sweat (moderate-intensity exercise). Do strengthening exercises at least twice a week. This is in addition to the moderate-intensity exercise. Spend less time sitting. Even light physical activity can be beneficial. Watch cholesterol and blood lipids Have your blood tested for lipids and cholesterol at 82 years of age, then have this test every 5 years. Have your cholesterol levels checked more often if: Your lipid or cholesterol levels are high. You are older than 82 years of age. You are at high risk for heart disease. What should I know about cancer screening? Depending on your health history and family history, you may need to have cancer screening at various ages. This may include screening for: Breast cancer. Cervical cancer. Colorectal cancer. Skin cancer. Lung cancer. What should I know about heart disease, diabetes, and high blood pressure? Blood pressure and heart disease High blood pressure causes heart disease and increases the risk of stroke. This is more likely to develop in people who have high blood pressure readings, are of African descent, or are overweight. Have your blood pressure checked: Every 3-5 years if you are 78-16 years of age. Every year if you are 48 years old or older. Diabetes Have regular diabetes screenings. This checks your fasting blood sugar level. Have the screening done: Once every three years after age 4 if you  are at a normal weight and have a low risk for diabetes. More often and at a younger age if you are overweight or have a high risk for diabetes. What should I know about preventing infection? Hepatitis B If you have a higher risk for hepatitis B, you should be screened for this virus. Talk with your health care provider to find out if you are at risk for hepatitis B infection. Hepatitis C Testing is recommended for: Everyone born from [birth year] through 1967. Anyone with known risk factors for hepatitis C. Sexually transmitted infections (STIs) Get screened for STIs, including gonorrhea and chlamydia, if: You are sexually active and are younger than 82 years of age. You are older than 82 years of age and your health care provider tells you that you are at risk for this type of infection. Your sexual activity has changed since you were last screened, and you are at increased risk for chlamydia or gonorrhea. Ask your health care provider if you are at risk. Ask your health care provider about whether you are at high risk for HIV. Your health care provider may recommend a prescription medicine to help prevent HIV infection. If you choose to take medicine to prevent HIV, you should first get tested for HIV. You should then be tested every 3 months for as long  as you are taking the medicine. Pregnancy If you are about to stop having your period (premenopausal) and you may become pregnant, seek counseling before you get pregnant. Take 400 to 800 micrograms (mcg) of folic acid every day if you become pregnant. Ask for birth control (contraception) if you want to prevent pregnancy. Osteoporosis and menopause Osteoporosis is a disease in which the bones lose minerals and strength with aging. This can result in bone fractures. If you are 54 years old or older, or if you are at risk for osteoporosis and fractures, ask your health care provider if you should: Be screened for bone loss. Take a calcium or vitamin  D supplement to lower your risk of fractures. Be given hormone replacement therapy (HRT) to treat symptoms of menopause. Follow these instructions at home: Lifestyle Do not use any products that contain nicotine or tobacco, such as cigarettes, e-cigarettes, and chewing tobacco. If you need help quitting, ask your health care provider. Do not use street drugs. Do not share needles. Ask your health care provider for help if you need support or information about quitting drugs. Alcohol use Do not drink alcohol if: Your health care provider tells you not to drink. You are pregnant, may be pregnant, or are planning to become pregnant. If you drink alcohol: Limit how much you use to 0-1 drink a day. Limit intake if you are breastfeeding. Be aware of how much alcohol is in your drink. In the [place] one drink equals one 12 oz bottle of beer (355 mL), one 5 oz glass of wine (148 mL), or one 1 oz glass of hard liquor (44 mL). General instructions Schedule regular health, dental, and eye exams. Stay current with your vaccines. Tell your health care provider if: You often feel depressed. You have ever been abused or do not feel safe at home. Summary Adopting a healthy lifestyle and getting preventive care are important in promoting health and wellness. Follow your health care provider's instructions about healthy diet, exercising, and getting tested or screened for diseases. Follow your health care provider's instructions on monitoring your cholesterol and blood pressure. This information is not intended to replace advice given to you by your health care provider. Make sure you discuss any questions you have with your health care provider. Document Revised: 12/09/2020 Document Reviewed: 09/24/2018 Elsevier Patient Education  2022 [employer].

## 2021-08-04 NOTE — Progress Notes (Signed)
HPI:  Patient is here for Prolia injection.  Pt reports taking calcium and vitamin D daily.  Pt's calcium levels and kidney functions was within normal limits.  Assessment and Plan:  Patient tolerated injection well without complications.  Pt advised to schedule next injection in 6 months.  Pt here for influenza vaccine.  Screening questionnaire reviewed, VIS provided to patient, and any/all patient questions answered.  Charyl Bigger, [place], CMA

## 2021-10-25 ENCOUNTER — Telehealth: Payer: Self-pay

## 2021-10-25 DIAGNOSIS — M810 Age-related osteoporosis without current pathological fracture: Secondary | ICD-10-CM

## 2021-10-25 NOTE — Telephone Encounter (Signed)
Medication: denosumab (PROLIA) injection 60 mg Prior authorization determination received Medication has been approved Approval dates: 10/15/2021-10/14/2022  Patient aware via: MyChart Provider aware via this encounter

## 2021-11-17 NOTE — Telephone Encounter (Signed)
Received Notice for Approval  Approved for 10/15/2021 - 10/14/2022  Placed in Metheney's basket

## 2022-01-30 ENCOUNTER — Telehealth: Payer: Self-pay

## 2022-01-30 ENCOUNTER — Other Ambulatory Visit: Payer: Self-pay

## 2022-01-30 DIAGNOSIS — M810 Age-related osteoporosis without current pathological fracture: Secondary | ICD-10-CM

## 2022-01-30 NOTE — Telephone Encounter (Signed)
Edit to add: ? ? ?Patient is scheduled for prolia on 02/19/2022 and I was just going to remind her to have labs done the week before. AMUCK ?

## 2022-01-30 NOTE — Telephone Encounter (Signed)
Attempted to call x2, phone would just ring & ring & then said "please enter your remote access code" and hung up. ?Will attempt to reach patient again later. ?

## 2022-01-30 NOTE — Telephone Encounter (Signed)
Patient due for Prolia after April 21st. She needs labs one week prior. Please call patient to schedule. Labs ordered.  ?

## 2022-02-05 ENCOUNTER — Ambulatory Visit: Payer: Medicare HMO

## 2022-02-12 ENCOUNTER — Other Ambulatory Visit: Payer: Self-pay

## 2022-02-12 DIAGNOSIS — M810 Age-related osteoporosis without current pathological fracture: Secondary | ICD-10-CM | POA: Diagnosis not present

## 2022-02-13 LAB — COMPLETE METABOLIC PANEL WITH GFR
AG Ratio: 1.6 (calc) (ref 1.0–2.5)
ALT: 10 U/L (ref 6–29)
AST: 14 U/L (ref 10–35)
Albumin: 4.2 g/dL (ref 3.6–5.1)
Alkaline phosphatase (APISO): 43 U/L (ref 37–153)
BUN/Creatinine Ratio: 24 (calc) — ABNORMAL HIGH (ref 6–22)
BUN: 23 mg/dL (ref 7–25)
CO2: 26 mmol/L (ref 20–32)
Calcium: 10.3 mg/dL (ref 8.6–10.4)
Chloride: 105 mmol/L (ref 98–110)
Creat: 0.96 mg/dL — ABNORMAL HIGH (ref 0.60–0.95)
Globulin: 2.6 g/dL (calc) (ref 1.9–3.7)
Glucose, Bld: 87 mg/dL (ref 65–99)
Potassium: 4.3 mmol/L (ref 3.5–5.3)
Sodium: 141 mmol/L (ref 135–146)
Total Bilirubin: 0.6 mg/dL (ref 0.2–1.2)
Total Protein: 6.8 g/dL (ref 6.1–8.1)
eGFR: 59 mL/min/{1.73_m2} — ABNORMAL LOW (ref >=60)

## 2022-02-13 NOTE — Progress Notes (Signed)
Your lab work is within acceptable range and there are no concerning findings.   ?

## 2022-02-19 ENCOUNTER — Ambulatory Visit (INDEPENDENT_AMBULATORY_CARE_PROVIDER_SITE_OTHER): Payer: Medicare HMO | Admitting: Family Medicine

## 2022-02-19 VITALS — BP 131/59 | HR 61

## 2022-02-19 DIAGNOSIS — Z23 Encounter for immunization: Secondary | ICD-10-CM | POA: Diagnosis not present

## 2022-02-19 DIAGNOSIS — M810 Age-related osteoporosis without current pathological fracture: Secondary | ICD-10-CM

## 2022-02-19 MED ORDER — DENOSUMAB 60 MG/ML ~~LOC~~ SOSY
60.0000 mg | PREFILLED_SYRINGE | Freq: Once | SUBCUTANEOUS | Status: AC
  Administered 2022-02-19: 60 mg via SUBCUTANEOUS

## 2022-02-19 NOTE — Progress Notes (Signed)
? ?  Established Patient Office Visit ? ?Subjective   ?Patient ID: Rhonda Scott, female    DOB: [birth year]  Age: 81 y.o. MRN: [id number] ? ?Chief Complaint  ?Patient presents with  ? Osteoporosis  ? ? ?HPI ? ?Rhonda Scott is here for Prolia injection and pneumococcal 20. ? ?ROS ? ?  ?Objective:  ?  ? ?BP (!) 131/59   Pulse 61   SpO2 100%  ? ? ?Physical Exam ? ? ?No results found for any visits on 02/19/22. ? ? ? ?The ASCVD Risk score (Arnett DK, et al., 2019) failed to calculate for the following reasons: ?  The 2019 ASCVD risk score is only valid for ages 39 to 56 ? ?  ?Assessment & Plan:  ?Osteoporosis - Patient tolerated injection well without complications. Patient advised to schedule next injection 6 months from today. ? ?Pneumococcal vaccine - Patient tolerated injection well without complications. ? ?Problem List Items Addressed This Visit   ? ? Osteoporosis - Primary  ? ?Other Visit Diagnoses   ? ? Need for vaccination for pneumococcus      ? Relevant Orders  ? Pneumococcal conjugate vaccine 20-valent (Prevnar 20) (Completed)  ? ?  ? ? ?Return in about 6 months (around 08/22/2022) for prolia injection. .  ? ? ?Lavell Luster, [place] ? ?

## 2022-02-19 NOTE — Progress Notes (Signed)
Agree with documentation as above.   Rhonda Costello, MD  

## 2022-04-11 DIAGNOSIS — H18593 Other hereditary corneal dystrophies, bilateral: Secondary | ICD-10-CM | POA: Diagnosis not present

## 2022-04-11 DIAGNOSIS — H25813 Combined forms of age-related cataract, bilateral: Secondary | ICD-10-CM | POA: Diagnosis not present

## 2022-04-11 DIAGNOSIS — H353132 Nonexudative age-related macular degeneration, bilateral, intermediate dry stage: Secondary | ICD-10-CM | POA: Diagnosis not present

## 2022-04-19 DIAGNOSIS — Z881 Allergy status to other antibiotic agents status: Secondary | ICD-10-CM | POA: Diagnosis not present

## 2022-04-19 DIAGNOSIS — Z96652 Presence of left artificial knee joint: Secondary | ICD-10-CM | POA: Diagnosis not present

## 2022-04-19 DIAGNOSIS — H25811 Combined forms of age-related cataract, right eye: Secondary | ICD-10-CM | POA: Insufficient documentation

## 2022-04-19 DIAGNOSIS — H25813 Combined forms of age-related cataract, bilateral: Secondary | ICD-10-CM | POA: Diagnosis not present

## 2022-05-01 DIAGNOSIS — Z888 Allergy status to other drugs, medicaments and biological substances status: Secondary | ICD-10-CM | POA: Diagnosis not present

## 2022-05-01 DIAGNOSIS — H25812 Combined forms of age-related cataract, left eye: Secondary | ICD-10-CM | POA: Diagnosis not present

## 2022-05-01 DIAGNOSIS — Z79899 Other long term (current) drug therapy: Secondary | ICD-10-CM | POA: Diagnosis not present

## 2022-05-07 DIAGNOSIS — C44311 Basal cell carcinoma of skin of nose: Secondary | ICD-10-CM | POA: Diagnosis not present

## 2022-05-07 DIAGNOSIS — L814 Other melanin hyperpigmentation: Secondary | ICD-10-CM | POA: Diagnosis not present

## 2022-05-07 DIAGNOSIS — L578 Other skin changes due to chronic exposure to nonionizing radiation: Secondary | ICD-10-CM | POA: Diagnosis not present

## 2022-05-07 DIAGNOSIS — L739 Follicular disorder, unspecified: Secondary | ICD-10-CM | POA: Diagnosis not present

## 2022-05-18 DIAGNOSIS — Z961 Presence of intraocular lens: Secondary | ICD-10-CM | POA: Diagnosis not present

## 2022-05-23 DIAGNOSIS — C44311 Basal cell carcinoma of skin of nose: Secondary | ICD-10-CM | POA: Diagnosis not present

## 2022-07-17 ENCOUNTER — Telehealth: Payer: Self-pay

## 2022-07-17 NOTE — Telephone Encounter (Signed)
Fax rec'd from [facility] automatically approving Prolia for the next calendar year. (10/15/22-10/15/23)  Auth# [id number]

## 2022-08-15 ENCOUNTER — Other Ambulatory Visit: Payer: Self-pay | Admitting: *Deleted

## 2022-08-15 DIAGNOSIS — R7309 Other abnormal glucose: Secondary | ICD-10-CM

## 2022-08-15 DIAGNOSIS — R7989 Other specified abnormal findings of blood chemistry: Secondary | ICD-10-CM

## 2022-08-15 DIAGNOSIS — E559 Vitamin D deficiency, unspecified: Secondary | ICD-10-CM

## 2022-08-15 DIAGNOSIS — M810 Age-related osteoporosis without current pathological fracture: Secondary | ICD-10-CM

## 2022-08-15 DIAGNOSIS — E038 Other specified hypothyroidism: Secondary | ICD-10-CM

## 2022-08-22 ENCOUNTER — Telehealth: Payer: Self-pay | Admitting: *Deleted

## 2022-08-22 ENCOUNTER — Ambulatory Visit (INDEPENDENT_AMBULATORY_CARE_PROVIDER_SITE_OTHER): Payer: Medicare HMO | Admitting: Family Medicine

## 2022-08-22 ENCOUNTER — Ambulatory Visit: Payer: Medicare HMO

## 2022-08-22 DIAGNOSIS — E038 Other specified hypothyroidism: Secondary | ICD-10-CM | POA: Diagnosis not present

## 2022-08-22 DIAGNOSIS — M8000XA Age-related osteoporosis with current pathological fracture, unspecified site, initial encounter for fracture: Secondary | ICD-10-CM

## 2022-08-22 DIAGNOSIS — M810 Age-related osteoporosis without current pathological fracture: Secondary | ICD-10-CM | POA: Diagnosis not present

## 2022-08-22 DIAGNOSIS — E559 Vitamin D deficiency, unspecified: Secondary | ICD-10-CM | POA: Diagnosis not present

## 2022-08-22 DIAGNOSIS — R7309 Other abnormal glucose: Secondary | ICD-10-CM | POA: Diagnosis not present

## 2022-08-22 NOTE — Progress Notes (Signed)
Pt was advised that she could not have prolia injection done today and that she would need to have her labs

## 2022-08-22 NOTE — Telephone Encounter (Signed)
Attempted to call pt to inform her that she will need to have her labs done and resulted BEFORE she can receive her prolia injection today however, unable to speak w/her or LVM to inform her of this. No answer or VM

## 2022-08-23 LAB — COMPLETE METABOLIC PANEL WITH GFR
AG Ratio: 1.6 (calc) (ref 1.0–2.5)
ALT: 7 U/L (ref 6–29)
AST: 12 U/L (ref 10–35)
Albumin: 4 g/dL (ref 3.6–5.1)
Alkaline phosphatase (APISO): 41 U/L (ref 37–153)
BUN/Creatinine Ratio: 30 (calc) — ABNORMAL HIGH (ref 6–22)
BUN: 27 mg/dL — ABNORMAL HIGH (ref 7–25)
CO2: 28 mmol/L (ref 20–32)
Calcium: 9.7 mg/dL (ref 8.6–10.4)
Chloride: 108 mmol/L (ref 98–110)
Creat: 0.9 mg/dL (ref 0.60–0.95)
Globulin: 2.5 g/dL (calc) (ref 1.9–3.7)
Glucose, Bld: 104 mg/dL — ABNORMAL HIGH (ref 65–99)
Potassium: 4.4 mmol/L (ref 3.5–5.3)
Sodium: 143 mmol/L (ref 135–146)
Total Bilirubin: 0.4 mg/dL (ref 0.2–1.2)
Total Protein: 6.5 g/dL (ref 6.1–8.1)
eGFR: 64 mL/min/{1.73_m2} (ref >=60)

## 2022-08-23 LAB — CBC
HCT: 38.4 % (ref 35.0–45.0)
Hemoglobin: 12.9 g/dL (ref 11.7–15.5)
MCH: 32.5 pg (ref 27.0–33.0)
MCHC: 33.6 g/dL (ref 32.0–36.0)
MCV: 96.7 fL (ref 80.0–100.0)
MPV: 9.5 fL (ref 7.5–12.5)
Platelets: 326 10*3/uL (ref 140–400)
RBC: 3.97 10*6/uL (ref 3.80–5.10)
RDW: 12.3 % (ref 11.0–15.0)
WBC: 6.9 10*3/uL (ref 3.8–10.8)

## 2022-08-23 LAB — HEMOGLOBIN A1C
Hgb A1c MFr Bld: 5.5 % of total Hgb (ref <5.7)
Mean Plasma Glucose: 111 mg/dL
eAG (mmol/L): 6.2 mmol/L

## 2022-08-23 LAB — VITAMIN D 25 HYDROXY (VIT D DEFICIENCY, FRACTURES): Vit D, 25-Hydroxy: 30 ng/mL (ref 30–100)

## 2022-08-23 LAB — TSH: TSH: 2.78 mIU/L (ref 0.40–4.50)

## 2022-08-27 NOTE — Progress Notes (Signed)
Hi Rhonda Scott, your kidney function is stable and liver function is stable.  Your blood count is normal.  Your thyroid looks great this time!!! Continue your current regimen.  Your vitamin D is on the very low end.  Please add 25 mcg of vitamin d daily to your regimen.  A1C look great!!!

## 2022-08-29 ENCOUNTER — Ambulatory Visit (INDEPENDENT_AMBULATORY_CARE_PROVIDER_SITE_OTHER): Payer: Medicare HMO | Admitting: Physician Assistant

## 2022-08-29 VITALS — BP 108/65 | HR 66 | Resp 20 | Ht 64.0 in | Wt 137.9 lb

## 2022-08-29 DIAGNOSIS — Z23 Encounter for immunization: Secondary | ICD-10-CM

## 2022-08-29 DIAGNOSIS — M810 Age-related osteoporosis without current pathological fracture: Secondary | ICD-10-CM | POA: Diagnosis not present

## 2022-08-29 MED ORDER — DENOSUMAB 60 MG/ML ~~LOC~~ SOSY
60.0000 mg | PREFILLED_SYRINGE | Freq: Once | SUBCUTANEOUS | Status: AC
  Administered 2022-08-29: 60 mg via SUBCUTANEOUS

## 2022-08-29 NOTE — Progress Notes (Signed)
   Subjective:    Patient ID: Rhonda Scott, female    DOB: 19-Jul-1941, 81 y.o.   MRN: [id number]  HPI Patient is here for a Prolia injection and High Dose Influenza vaccine. Patient reports taking calcium and vitamin D daily.    Review of Systems     Objective:   Physical Exam        Assessment & Plan:   Patient tolerated injection well without complications. Patient advised to schedule next Prolia injection in 6 months.

## 2022-08-29 NOTE — Progress Notes (Signed)
Agree with above plan. 

## 2022-12-12 ENCOUNTER — Encounter: Payer: Self-pay | Admitting: *Deleted

## 2022-12-13 DIAGNOSIS — L245 Irritant contact dermatitis due to other chemical products: Secondary | ICD-10-CM | POA: Diagnosis not present

## 2022-12-13 DIAGNOSIS — C44319 Basal cell carcinoma of skin of other parts of face: Secondary | ICD-10-CM | POA: Diagnosis not present

## 2022-12-13 DIAGNOSIS — L821 Other seborrheic keratosis: Secondary | ICD-10-CM | POA: Diagnosis not present

## 2022-12-20 DIAGNOSIS — C44319 Basal cell carcinoma of skin of other parts of face: Secondary | ICD-10-CM | POA: Diagnosis not present

## 2022-12-20 DIAGNOSIS — L739 Follicular disorder, unspecified: Secondary | ICD-10-CM | POA: Diagnosis not present

## 2023-02-20 ENCOUNTER — Other Ambulatory Visit: Payer: Medicare HMO

## 2023-02-20 ENCOUNTER — Other Ambulatory Visit: Payer: Self-pay

## 2023-02-20 DIAGNOSIS — M810 Age-related osteoporosis without current pathological fracture: Secondary | ICD-10-CM

## 2023-02-20 NOTE — Progress Notes (Signed)
Ordered labs

## 2023-02-21 LAB — COMPLETE METABOLIC PANEL WITH GFR
AG Ratio: 1.4 (calc) (ref 1.0–2.5)
ALT: 8 U/L (ref 6–29)
AST: 14 U/L (ref 10–35)
Albumin: 4.1 g/dL (ref 3.6–5.1)
Alkaline phosphatase (APISO): 41 U/L (ref 37–153)
BUN/Creatinine Ratio: 23 (calc) — ABNORMAL HIGH (ref 6–22)
BUN: 22 mg/dL (ref 7–25)
CO2: 25 mmol/L (ref 20–32)
Calcium: 10.4 mg/dL (ref 8.6–10.4)
Chloride: 105 mmol/L (ref 98–110)
Creat: 0.96 mg/dL — ABNORMAL HIGH (ref 0.60–0.95)
Globulin: 3 g/dL (calc) (ref 1.9–3.7)
Glucose, Bld: 94 mg/dL (ref 65–99)
Potassium: 4.5 mmol/L (ref 3.5–5.3)
Sodium: 140 mmol/L (ref 135–146)
Total Bilirubin: 0.7 mg/dL (ref 0.2–1.2)
Total Protein: 7.1 g/dL (ref 6.1–8.1)
eGFR: 59 mL/min/{1.73_m2} — ABNORMAL LOW (ref >=60)

## 2023-02-21 NOTE — Progress Notes (Signed)
Your lab work is within acceptable range and there are no concerning findings.   ?

## 2023-02-27 ENCOUNTER — Ambulatory Visit: Payer: Medicare HMO

## 2023-02-28 ENCOUNTER — Ambulatory Visit (INDEPENDENT_AMBULATORY_CARE_PROVIDER_SITE_OTHER): Payer: Medicare HMO | Admitting: Family Medicine

## 2023-02-28 VITALS — BP 122/52 | HR 60

## 2023-02-28 DIAGNOSIS — M810 Age-related osteoporosis without current pathological fracture: Secondary | ICD-10-CM | POA: Diagnosis not present

## 2023-02-28 MED ORDER — DENOSUMAB 60 MG/ML ~~LOC~~ SOSY
60.0000 mg | PREFILLED_SYRINGE | Freq: Once | SUBCUTANEOUS | Status: AC
  Administered 2023-02-28: 60 mg via SUBCUTANEOUS

## 2023-02-28 NOTE — Progress Notes (Signed)
   Established Patient Office Visit  Subjective   Patient ID: Rhonda Scott, female    DOB: 1941-01-31  Age: 82 y.o. MRN: [id number]  Chief Complaint  Patient presents with   Osteoporosis    HPI  Rhonda Scott is here for Prolia injection. She reports taking Calcium and Vitamin D daily.   ROS    Objective:     BP (!) 122/52   Pulse 60   SpO2 100%    Physical Exam   No results found for any visits on 02/28/23.    The ASCVD Risk score (Arnett DK, et al., 2019) failed to calculate for the following reasons:   The 2019 ASCVD risk score is only valid for ages 52 to 39    Assessment & Plan:  Prolia injection - Patient tolerated injection well without complications. Patient advised to schedule next injection 6 months and 1 day from today.  Problem List Items Addressed This Visit       Unprioritized   Osteoporosis - Primary    Return in about 6 months (around 09/01/2023) for Prolia injection. Earna Coder, Janalyn Harder, CMA

## 2023-02-28 NOTE — Progress Notes (Signed)
Agree with documentation as above.   Deryck Hippler, MD  

## 2023-07-10 NOTE — Telephone Encounter (Addendum)
Old encounter. Received notification that this hasn't been closed. Pt was not seen due to not having updated labs.

## 2023-07-10 NOTE — Progress Notes (Signed)
Pt rescheduled

## 2023-08-12 DIAGNOSIS — M5459 Other low back pain: Secondary | ICD-10-CM | POA: Diagnosis not present

## 2023-09-02 ENCOUNTER — Ambulatory Visit: Payer: Medicare HMO

## 2023-09-09 DIAGNOSIS — I82412 Acute embolism and thrombosis of left femoral vein: Secondary | ICD-10-CM | POA: Diagnosis not present

## 2023-09-09 DIAGNOSIS — I82432 Acute embolism and thrombosis of left popliteal vein: Secondary | ICD-10-CM | POA: Diagnosis not present

## 2023-09-09 DIAGNOSIS — I2699 Other pulmonary embolism without acute cor pulmonale: Secondary | ICD-10-CM | POA: Diagnosis not present

## 2023-09-09 DIAGNOSIS — I2694 Multiple subsegmental pulmonary emboli without acute cor pulmonale: Secondary | ICD-10-CM | POA: Diagnosis not present

## 2023-09-09 DIAGNOSIS — I82442 Acute embolism and thrombosis of left tibial vein: Secondary | ICD-10-CM | POA: Diagnosis not present

## 2023-09-09 DIAGNOSIS — J189 Pneumonia, unspecified organism: Secondary | ICD-10-CM | POA: Diagnosis not present

## 2023-09-09 DIAGNOSIS — I82452 Acute embolism and thrombosis of left peroneal vein: Secondary | ICD-10-CM | POA: Diagnosis not present

## 2023-09-09 DIAGNOSIS — J90 Pleural effusion, not elsewhere classified: Secondary | ICD-10-CM | POA: Diagnosis not present

## 2023-09-09 DIAGNOSIS — M5459 Other low back pain: Secondary | ICD-10-CM | POA: Diagnosis not present

## 2023-09-09 DIAGNOSIS — J9811 Atelectasis: Secondary | ICD-10-CM | POA: Diagnosis not present

## 2023-09-09 DIAGNOSIS — R0989 Other specified symptoms and signs involving the circulatory and respiratory systems: Secondary | ICD-10-CM | POA: Diagnosis not present

## 2023-09-09 DIAGNOSIS — R918 Other nonspecific abnormal finding of lung field: Secondary | ICD-10-CM | POA: Diagnosis not present

## 2023-09-09 DIAGNOSIS — M7989 Other specified soft tissue disorders: Secondary | ICD-10-CM | POA: Diagnosis not present

## 2023-09-09 DIAGNOSIS — K449 Diaphragmatic hernia without obstruction or gangrene: Secondary | ICD-10-CM | POA: Diagnosis not present

## 2023-09-16 ENCOUNTER — Ambulatory Visit: Payer: Medicare HMO

## 2023-09-23 ENCOUNTER — Ambulatory Visit: Payer: Medicare HMO

## 2023-09-30 DIAGNOSIS — M5136 Other intervertebral disc degeneration, lumbar region with discogenic back pain only: Secondary | ICD-10-CM | POA: Diagnosis not present

## 2023-09-30 DIAGNOSIS — M47816 Spondylosis without myelopathy or radiculopathy, lumbar region: Secondary | ICD-10-CM | POA: Diagnosis not present

## 2023-09-30 DIAGNOSIS — M5126 Other intervertebral disc displacement, lumbar region: Secondary | ICD-10-CM | POA: Diagnosis not present

## 2023-09-30 DIAGNOSIS — M5459 Other low back pain: Secondary | ICD-10-CM | POA: Diagnosis not present

## 2023-09-30 DIAGNOSIS — M5137 Other intervertebral disc degeneration, lumbosacral region with discogenic back pain only: Secondary | ICD-10-CM | POA: Diagnosis not present

## 2023-10-15 DIAGNOSIS — M47816 Spondylosis without myelopathy or radiculopathy, lumbar region: Secondary | ICD-10-CM | POA: Diagnosis not present

## 2023-10-15 DIAGNOSIS — M5126 Other intervertebral disc displacement, lumbar region: Secondary | ICD-10-CM | POA: Diagnosis not present

## 2023-10-21 ENCOUNTER — Telehealth: Payer: Self-pay | Admitting: Family Medicine

## 2023-10-21 NOTE — Telephone Encounter (Signed)
 Copied from CRM [id number]. Topic: Appointments - Scheduling Inquiry for Clinic >> Oct 21, 2023  1:29 PM Deleta HERO wrote: Reason for CRM: The patient needs to schedule appointment for prolia  injections with clinical nurses. She would like to determine if there is any openings in the 1st week of February if possible.  Callback #: [phone]

## 2023-10-28 ENCOUNTER — Ambulatory Visit: Payer: Medicare HMO | Admitting: Family Medicine

## 2023-11-05 ENCOUNTER — Telehealth: Payer: Self-pay

## 2023-11-05 NOTE — Telephone Encounter (Signed)
Patient is scheduled with provider on 11/20/23  Prior auth for: PROLIA Determination: APPROVED Auth #: [id number] Valid from: 10/16/23 to 10/14/24

## 2023-11-05 NOTE — Telephone Encounter (Signed)
Copied from CRM [id number]. Topic: Appointments - Scheduling Inquiry for Clinic >> Nov 05, 2023  2:18 PM Fredrica W wrote: Reason for CRM: Patient called to schedule Prolia injection. Appt for 2/5 was for injection but she states she sees Dr Linford Arnold for something else and the nurse does the injection. She is restarting since she has missed previous appts. Thank You

## 2023-11-06 DIAGNOSIS — M5416 Radiculopathy, lumbar region: Secondary | ICD-10-CM | POA: Diagnosis not present

## 2023-11-08 NOTE — Telephone Encounter (Signed)
Called pt and advised that I included that she can get the prolia done the same day if Dr. Linford Arnold is ok with that.

## 2023-11-14 ENCOUNTER — Other Ambulatory Visit: Payer: Self-pay

## 2023-11-14 DIAGNOSIS — M810 Age-related osteoporosis without current pathological fracture: Secondary | ICD-10-CM

## 2023-11-14 NOTE — Progress Notes (Signed)
Labs for Prolia.

## 2023-11-15 ENCOUNTER — Encounter: Payer: Self-pay | Admitting: Family Medicine

## 2023-11-15 LAB — CMP14+EGFR
ALT: 10 [IU]/L (ref 0–32)
AST: 16 [IU]/L (ref 0–40)
Albumin: 4.2 g/dL (ref 3.7–4.7)
Alkaline Phosphatase: 62 [IU]/L (ref 44–121)
BUN/Creatinine Ratio: 18 (ref 12–28)
BUN: 17 mg/dL (ref 8–27)
Bilirubin Total: 0.3 mg/dL (ref 0.0–1.2)
CO2: 24 mmol/L (ref 20–29)
Calcium: 10.5 mg/dL — ABNORMAL HIGH (ref 8.7–10.3)
Chloride: 105 mmol/L (ref 96–106)
Creatinine, Ser: 0.93 mg/dL (ref 0.57–1.00)
Globulin, Total: 2.6 g/dL (ref 1.5–4.5)
Glucose: 121 mg/dL — ABNORMAL HIGH (ref 70–99)
Potassium: 4 mmol/L (ref 3.5–5.2)
Sodium: 144 mmol/L (ref 134–144)
Total Protein: 6.8 g/dL (ref 6.0–8.5)
eGFR: 61 mL/min/{1.73_m2} (ref >59)

## 2023-11-15 NOTE — Progress Notes (Signed)
 Your lab work is within acceptable range and there are no concerning findings.   ?

## 2023-11-20 ENCOUNTER — Ambulatory Visit: Payer: Medicare HMO

## 2023-11-20 ENCOUNTER — Ambulatory Visit: Payer: Medicare HMO | Admitting: Family Medicine

## 2023-11-20 ENCOUNTER — Encounter: Payer: Self-pay | Admitting: Family Medicine

## 2023-11-20 VITALS — BP 123/58 | HR 67 | Ht 64.0 in | Wt 136.0 lb

## 2023-11-20 DIAGNOSIS — I2699 Other pulmonary embolism without acute cor pulmonale: Secondary | ICD-10-CM | POA: Diagnosis not present

## 2023-11-20 DIAGNOSIS — J189 Pneumonia, unspecified organism: Secondary | ICD-10-CM

## 2023-11-20 DIAGNOSIS — M810 Age-related osteoporosis without current pathological fracture: Secondary | ICD-10-CM | POA: Diagnosis not present

## 2023-11-20 DIAGNOSIS — I82412 Acute embolism and thrombosis of left femoral vein: Secondary | ICD-10-CM | POA: Diagnosis not present

## 2023-11-20 DIAGNOSIS — Z23 Encounter for immunization: Secondary | ICD-10-CM

## 2023-11-20 DIAGNOSIS — R918 Other nonspecific abnormal finding of lung field: Secondary | ICD-10-CM | POA: Diagnosis not present

## 2023-11-20 DIAGNOSIS — M549 Dorsalgia, unspecified: Secondary | ICD-10-CM | POA: Diagnosis not present

## 2023-11-20 DIAGNOSIS — M419 Scoliosis, unspecified: Secondary | ICD-10-CM | POA: Diagnosis not present

## 2023-11-20 DIAGNOSIS — I700 Atherosclerosis of aorta: Secondary | ICD-10-CM | POA: Diagnosis not present

## 2023-11-20 MED ORDER — DENOSUMAB 60 MG/ML ~~LOC~~ SOSY
60.0000 mg | PREFILLED_SYRINGE | Freq: Once | SUBCUTANEOUS | Status: AC
  Administered 2024-06-09: 60 mg via SUBCUTANEOUS

## 2023-11-20 MED ORDER — DENOSUMAB 60 MG/ML ~~LOC~~ SOSY
60.0000 mg | PREFILLED_SYRINGE | Freq: Once | SUBCUTANEOUS | Status: AC
  Administered 2023-11-20: 60 mg via SUBCUTANEOUS

## 2023-11-20 NOTE — Progress Notes (Signed)
 Established Patient Office Visit  Subjective  Patient ID: Rhonda Scott, female    DOB: March 31, 1941  Age: 83 y.o. MRN: [id number]  Chief Complaint  Patient presents with   Follow-up   prolia  injection    HPI  She is actually for her routine follow-up but since I last saw her she actually went to the emergency department on November 25 for community-acquired pneumonia and was also diagnosed with bilateral pulmonary embolisms.  She was started on apixaban  10 mg twice a day for 7 days and then 5 mg twice a day with outpatient treatment for community-acquired pneumonia including azithromycin  and Omnicef at that time.  She is feeling better in regards to her chest bsxs  She is taking the apixaban  and tolerating it well.  She says it is quite costly and wants to know how much longer she will need to take the medication.  She also says that the swelling in her left leg from the DVT has improved greatly.  She also in the last several head days has had intense mid back pain just below the bra strap area.  She has been following with the orthopedist for lumbar spine pain and recently received some injections.  There is an MRI of the lumbar spine on file but this is a new location of pain and she says is actually worse than the pain that she was having in her low back.  She denies any known injury or trauma she has not been doing any lifting or repetitive motions.    ROS    Objective:     BP (!) 123/58   Pulse 67   Ht 5' 4 (1.626 m)   Wt 136 lb (61.7 kg)   SpO2 100%   BMI 23.34 kg/m    Physical Exam Vitals and nursing note reviewed.  Constitutional:      Appearance: Normal appearance.  HENT:     Head: Normocephalic and atraumatic.     Right Ear: Tympanic membrane, ear canal and external ear normal. There is no impacted cerumen.     Left Ear: Tympanic membrane, ear canal and external ear normal. There is no impacted cerumen.     Nose: Nose normal.     Mouth/Throat:      Pharynx: Oropharynx is clear.  Eyes:     Conjunctiva/sclera: Conjunctivae normal.  Cardiovascular:     Rate and Rhythm: Normal rate and regular rhythm.  Pulmonary:     Effort: Pulmonary effort is normal.     Breath sounds: Normal breath sounds.  Musculoskeletal:     Cervical back: Neck supple. No tenderness.  Lymphadenopathy:     Cervical: No cervical adenopathy.  Skin:    General: Skin is warm and dry.  Neurological:     Mental Status: She is alert and oriented to person, place, and time.  Psychiatric:        Mood and Affect: Mood normal.     No results found for any visits on 11/20/23.    The ASCVD Risk score (Arnett DK, et al., 2019) failed to calculate for the following reasons:   The 2019 ASCVD risk score is only valid for ages 61 to 46    Assessment & Plan:   Problem List Items Addressed This Visit       Musculoskeletal and Integument   Osteoporosis - Primary   Relevant Medications   denosumab  (PROLIA ) injection 60 mg (Start on 05/20/2024 12:00 AM)   Other Visit Diagnoses  Encounter for immunization       Relevant Orders   Flu Vaccine Trivalent High Dose (Fluad) (Completed)     Mid back pain       Relevant Orders   DG Thoracic Spine W/Swimmers     Pneumonia due to infectious organism, unspecified laterality, unspecified part of lung       Relevant Orders   DG Chest 2 View     Bilateral pulmonary embolism (HCC)       Relevant Medications   apixaban  (ELIQUIS ) 5 MG TABS tablet     Acute deep vein thrombosis (DVT) of femoral vein of left lower extremity (HCC)       Relevant Medications   apixaban  (ELIQUIS ) 5 MG TABS tablet   Other Relevant Orders   US  Venous Img Lower Unilateral Left       Mid back pain -concerned about the possibility of a vertebral fracture.  Has a prior history of a compression fracture at T10.  Will get plain film today for further workup.  History of osteoporosis as well.  She does take calcium and is on Prolia .  In fact she is  due for her Prolia  injection today.  Follow-up pneumonia-I like to get repeat chest x-ray today just to make sure that there is nothing underlying the affected area.  Return in about 3 months (around 02/17/2024) for blood thinner.    Dorothyann Byars, MD

## 2023-11-20 NOTE — Patient Instructions (Signed)
 Follow up in 6 months for nurse visit for Prolia  injection.

## 2023-11-20 NOTE — Progress Notes (Signed)
 Pt was given Prolia  today while being seen today for her ED follow up.  She reports taking Calcium and Vitamin D  daily as directed.   Prolia  injection given in RD and tolerated well with no complications. Pt advised to RTC in 6 months for next injection.

## 2023-11-25 ENCOUNTER — Other Ambulatory Visit: Payer: Medicare HMO

## 2023-11-28 ENCOUNTER — Encounter: Payer: Self-pay | Admitting: Family Medicine

## 2023-11-28 DIAGNOSIS — R9389 Abnormal findings on diagnostic imaging of other specified body structures: Secondary | ICD-10-CM

## 2023-11-28 DIAGNOSIS — S22000A Wedge compression fracture of unspecified thoracic vertebra, initial encounter for closed fracture: Secondary | ICD-10-CM | POA: Diagnosis not present

## 2023-11-28 DIAGNOSIS — M546 Pain in thoracic spine: Secondary | ICD-10-CM | POA: Diagnosis not present

## 2023-11-28 DIAGNOSIS — J189 Pneumonia, unspecified organism: Secondary | ICD-10-CM

## 2023-11-28 NOTE — Progress Notes (Signed)
 Hi Rhonda Scott, your chest x-ray shows still some opacity in that right middle lung.  They feel like it could actually just be an overlap of tissue they are causing it.  And possibly what looks like some scar tissue in the top of the right lung.  They are actually recommending that we consider a CT given that they do not have any more older detailed films for comparison.  Just to make sure that everything is okay.  If you are okay with moving forward with a CAT scan we will get that set up for you just let me know.

## 2023-11-29 ENCOUNTER — Encounter: Payer: Self-pay | Admitting: Family Medicine

## 2023-11-29 NOTE — Progress Notes (Signed)
 Hi Chantille, x-ray of your back showed a compression fracture at your T8 which is in that lower back area and a moderate compression deformity at T10 which is about 2 levels lower closer to your belt line.  They are unable to tell if these are old or if they are relatively new.  Are you aware of having had any kind of prior compression fracture before?

## 2023-12-02 ENCOUNTER — Other Ambulatory Visit: Payer: Self-pay | Admitting: Family Medicine

## 2023-12-02 NOTE — Telephone Encounter (Unsigned)
 Copied from CRM [id number]. Topic: Clinical - Medication Refill >> Dec 02, 2023 10:10 AM [name] wrote: Most Recent Primary Care Visit:  Provider: Nani Gasser D  Department: [facility]  Visit Type: OFFICE VISIT  Date: 11/20/2023  Medication: apixaban (ELIQUIS) 5 MG TABS tablet [[id number]]  Has the patient contacted their pharmacy? Yes (Agent: If no, request that the patient contact the pharmacy for the refill. If patient does not wish to contact the pharmacy document the reason why and proceed with request.) (Agent: If yes, when and what did the pharmacy advise?)  Is this the correct pharmacy for this prescription? Yes If no, delete pharmacy and type the correct one.  This is the patient's preferred pharmacy:  [facility] [address], [place] - [address] [place] [address] Phone: [phone] Fax: [phone]   Has the prescription been filled recently? No  Is the patient out of the medication? Yes  Has the patient been seen for an appointment in the last year OR does the patient have an upcoming appointment? Yes  Can we respond through MyChart? Yes  Agent: Please be advised that Rx refills may take up to 3 business days. We ask that you follow-up with your pharmacy.

## 2023-12-03 ENCOUNTER — Other Ambulatory Visit: Payer: Self-pay | Admitting: *Deleted

## 2023-12-03 MED ORDER — APIXABAN 5 MG PO TABS: 5.0000 mg | ORAL_TABLET | Freq: Two times a day (BID) | ORAL | 2 refills | Status: DC

## 2023-12-03 NOTE — Progress Notes (Signed)
 I saw that she saw the orthopedist on the 14th.  I would highly recommend we get an updated bone density.  We really need to get her on medication to help strengthen her bones especially if she is already having evidence of compression fractures in her spine.

## 2023-12-05 NOTE — Telephone Encounter (Signed)
 Prior auth for: PROLIA Determination: APPROVED Auth #: [id number] Valid from: 10/16/23 to 10/14/24  Patient was seen on 11/20/23 by the provider and was given the Prolia injection during her appointment.

## 2023-12-12 ENCOUNTER — Ambulatory Visit: Payer: Medicare HMO

## 2023-12-12 DIAGNOSIS — I82512 Chronic embolism and thrombosis of left femoral vein: Secondary | ICD-10-CM | POA: Diagnosis not present

## 2023-12-12 DIAGNOSIS — I82412 Acute embolism and thrombosis of left femoral vein: Secondary | ICD-10-CM

## 2023-12-12 DIAGNOSIS — R9389 Abnormal findings on diagnostic imaging of other specified body structures: Secondary | ICD-10-CM

## 2023-12-12 DIAGNOSIS — S22078D Other fracture of T9-T10 vertebra, subsequent encounter for fracture with routine healing: Secondary | ICD-10-CM

## 2023-12-12 DIAGNOSIS — J189 Pneumonia, unspecified organism: Secondary | ICD-10-CM

## 2023-12-12 DIAGNOSIS — S22068D Other fracture of T7-T8 thoracic vertebra, subsequent encounter for fracture with routine healing: Secondary | ICD-10-CM | POA: Diagnosis not present

## 2023-12-12 DIAGNOSIS — R918 Other nonspecific abnormal finding of lung field: Secondary | ICD-10-CM

## 2023-12-13 ENCOUNTER — Encounter: Payer: Self-pay | Admitting: Family Medicine

## 2023-12-13 NOTE — Progress Notes (Signed)
 Okay, I apologize I did not see the Prolia on the Main med list.  But I do see it now.  I still think it would be helpful to get a bone density to track over time but it certainly not urgent.

## 2023-12-13 NOTE — Progress Notes (Signed)
 Hi Rhonda Scott, follow-up ultrasound shows what is now a chronic thrombus in the common femoral vein.  So the clot did not completely dissolve and it is now chronic.  The distal femoral vein and popliteal vein look clear which is great.  And there is still an occlusive hardened clot in the calf area as well.  This can lead to some intermittent swelling in that leg that may happen over time especially if you are on your feet a lot or if you get out in the heat.    Wanted to clarify with you was this the first time you have ever had a blood clot in your lungs or chest?  Or have you have or had this happen before?

## 2023-12-16 NOTE — Progress Notes (Signed)
 Nothing additional to address just wanted to make sure has a follow up

## 2023-12-26 DIAGNOSIS — S22000A Wedge compression fracture of unspecified thoracic vertebra, initial encounter for closed fracture: Secondary | ICD-10-CM | POA: Diagnosis not present

## 2023-12-26 DIAGNOSIS — M546 Pain in thoracic spine: Secondary | ICD-10-CM | POA: Diagnosis not present

## 2024-01-03 ENCOUNTER — Encounter: Payer: Self-pay | Admitting: Family Medicine

## 2024-01-03 NOTE — Progress Notes (Signed)
 Hi [name], the CT of the chest shows mild enlargement of the heart called cardiac megaly but no fluid around that which is great.  A little fat pad near the heart which they think was the opacity that they saw on the chest x-ray.  It can cause an overlapping shadow.  These are normal and this is reassuring so no sign of an actual mass which is great.  You do have some scar tissue at the base of the lungs on both sides.  But a scarring in the right upper lung as well.  It looks like the compression fracture at T8 looks maybe a little worse but the 1 at T10 is stable.  Does look like there is some healing going on in the bone which is great.  Just noted some plaque buildup in the aorta and the arteries around the heart.

## 2024-01-30 DIAGNOSIS — M7918 Myalgia, other site: Secondary | ICD-10-CM | POA: Diagnosis not present

## 2024-01-30 DIAGNOSIS — M5416 Radiculopathy, lumbar region: Secondary | ICD-10-CM | POA: Diagnosis not present

## 2024-02-17 ENCOUNTER — Encounter: Payer: Self-pay | Admitting: Family Medicine

## 2024-02-17 ENCOUNTER — Ambulatory Visit (INDEPENDENT_AMBULATORY_CARE_PROVIDER_SITE_OTHER): Payer: Medicare HMO | Admitting: Family Medicine

## 2024-02-17 VITALS — BP 134/73 | HR 73 | Ht 64.0 in | Wt 133.0 lb

## 2024-02-17 DIAGNOSIS — M810 Age-related osteoporosis without current pathological fracture: Secondary | ICD-10-CM | POA: Diagnosis not present

## 2024-02-17 DIAGNOSIS — R42 Dizziness and giddiness: Secondary | ICD-10-CM

## 2024-02-17 DIAGNOSIS — I82512 Chronic embolism and thrombosis of left femoral vein: Secondary | ICD-10-CM | POA: Diagnosis not present

## 2024-02-17 DIAGNOSIS — E038 Other specified hypothyroidism: Secondary | ICD-10-CM | POA: Diagnosis not present

## 2024-02-17 DIAGNOSIS — E559 Vitamin D deficiency, unspecified: Secondary | ICD-10-CM

## 2024-02-17 NOTE — Assessment & Plan Note (Signed)
 Discussed getting an up-to-date DEXA.  Her 5 years will be in August of this summer but she has not suffered a fracture while on the medications so that would go against the recommendation of taking a drug holiday at this point.

## 2024-02-17 NOTE — Assessment & Plan Note (Signed)
 She had a thromboembolism in 2 areas in her left leg and also bilaterally in both lungs she had no specific cause for this.  She had not traveled or prolonged sitting or recent illness though she was treated for pneumonia during the hospitalization but she says she never actually had significant symptoms to indicate pneumonia and it was a bit of a soft call based on the CT scan.  We did discuss getting some additional labs to rule out hypercoagulability.  We could consider coming off of the medication but again I am quite concerned about the extensiveness of her embolisms without a specific trigger.

## 2024-02-17 NOTE — Assessment & Plan Note (Signed)
To recheck TSH.

## 2024-02-17 NOTE — Progress Notes (Signed)
 Established Patient Office Visit  Subjective  Patient ID: Rhonda Scott, female    DOB: 09-01-1941  Age: 83 y.o. MRN: [id number]  Chief Complaint  Patient presents with   Medical Management of Chronic Issues    HPI  She recently had a follow-up appointment [facility] [place] [place], for follow-up of lower back pain.  She had vertebral fractures and underwent ESI.  She says she still cannot stay on her feet very long if she goes to the store the pain gets so intense that she feels sweaty and then feels like she is going to pass out.  She sits and rest for few minutes she can recover get some relief and then keep going.  She is currently on oxycodone as needed.  Will been on Prolia  for 5 years as of August of this summer and wants to know if she could take a break from the medication.  Is also here for 49-month follow-up for left lower extremity DVT and bilateral pulmonary emboli diagnosed in November 2024.  We did do a repeat ultrasound in February showing subacute chronic nonocclusive thrombus noted in the common femoral vein in the proximal thigh the mid and distal femoral vein and the popliteal vein were open without residual DVT.  And there was persistent occlusive DVT within the tibioperoneal trunk and the calf.  She has no other prior history of DVT.    ROS    Objective:     BP 134/73   Pulse 73   Ht 5\' 4"  (1.626 m)   Wt 133 lb (60.3 kg)   SpO2 99%   BMI 22.83 kg/m    Physical Exam Vitals and nursing note reviewed.  Constitutional:      Appearance: Normal appearance.  HENT:     Head: Normocephalic and atraumatic.  Eyes:     Conjunctiva/sclera: Conjunctivae normal.  Cardiovascular:     Rate and Rhythm: Normal rate and regular rhythm.  Pulmonary:     Effort: Pulmonary effort is normal.     Breath sounds: Normal breath sounds.  Skin:    General: Skin is warm and dry.  Neurological:     Mental Status: She is alert.  Psychiatric:        Mood and Affect:  Mood normal.      No results found for any visits on 02/17/24.    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  She had not traveled or prolonged sitting or recent illness though she was treated for pneumonia during the hospitalization but she says she never actually had significant symptoms to indicate pneumonia and it was a bit of a soft call based on the CT scan.  We did discuss getting some additional labs to rule out hypercoagulability.  We could consider coming off of the medication but again I am quite concerned about the extensiveness of her embolisms without a specific trigger.      Relevant Orders   Hypercoagulable panel, comprehensive     Endocrine   Subclinical  hypothyroidism   To recheck TSH.      Relevant Orders   CMP14+EGFR   CBC with Differential/Platelet   TSH     Musculoskeletal and Integument   Osteoporosis - Primary   Discussed getting an up-to-date DEXA.  Her 5 years will be in August of this summer but she has not suffered a fracture while on the medications so that would go against the recommendation of taking a drug holiday at this point.      Relevant Orders   VITAMIN D  25 Hydroxy (Vit-D Deficiency, Fractures)   DG Bone Density     Other   Vitamin D  deficiency   Recheck vitamin D  level today she does take a supplement.      Relevant Orders   VITAMIN D  25 Hydroxy (Vit-D Deficiency, Fractures)   Other Visit Diagnoses       Lightheadedness       Relevant Orders   CMP14+EGFR   CBC with Differential/Platelet   TSH       Lightheadedness-unclear  if it secondary to intense pain or if she may have something else going on we discussed getting orthostatics today.  And consider even getting a heart monitor just to look for any episodes of bradycardia or arrhythmia.   No follow-ups on file.    Duaine German, MD

## 2024-02-17 NOTE — Assessment & Plan Note (Signed)
 Recheck vitamin D  level today she does take a supplement.

## 2024-02-18 ENCOUNTER — Encounter: Payer: Self-pay | Admitting: Family Medicine

## 2024-02-18 LAB — CBC WITH DIFFERENTIAL/PLATELET
Basophils Absolute: 0 10*3/uL (ref 0.0–0.2)
Basos: 0 %
EOS (ABSOLUTE): 0.1 10*3/uL (ref 0.0–0.4)
Eos: 1 %
Hematocrit: 44.9 % (ref 34.0–46.6)
Hemoglobin: 14.7 g/dL (ref 11.1–15.9)
Immature Grans (Abs): 0 10*3/uL (ref 0.0–0.1)
Immature Granulocytes: 0 %
Lymphocytes Absolute: 2.1 10*3/uL (ref 0.7–3.1)
Lymphs: 18 %
MCH: 31 pg (ref 26.6–33.0)
MCHC: 32.7 g/dL (ref 31.5–35.7)
MCV: 95 fL (ref 79–97)
Monocytes Absolute: 0.8 10*3/uL (ref 0.1–0.9)
Monocytes: 7 %
Neutrophils Absolute: 8.3 10*3/uL — ABNORMAL HIGH (ref 1.4–7.0)
Neutrophils: 74 %
Platelets: 378 10*3/uL (ref 150–450)
RBC: 4.74 x10E6/uL (ref 3.77–5.28)
RDW: 12.8 % (ref 11.7–15.4)
WBC: 11.3 10*3/uL — ABNORMAL HIGH (ref 3.4–10.8)

## 2024-02-18 LAB — CMP14+EGFR
ALT: 12 IU/L (ref 0–32)
AST: 16 IU/L (ref 0–40)
Albumin: 4.5 g/dL (ref 3.7–4.7)
Alkaline Phosphatase: 58 IU/L (ref 44–121)
BUN/Creatinine Ratio: 25 (ref 12–28)
BUN: 22 mg/dL (ref 8–27)
Bilirubin Total: 0.9 mg/dL (ref 0.0–1.2)
CO2: 18 mmol/L — ABNORMAL LOW (ref 20–29)
Calcium: 10.1 mg/dL (ref 8.7–10.3)
Chloride: 102 mmol/L (ref 96–106)
Creatinine, Ser: 0.88 mg/dL (ref 0.57–1.00)
Globulin, Total: 2.6 g/dL (ref 1.5–4.5)
Glucose: 82 mg/dL (ref 70–99)
Potassium: 4.1 mmol/L (ref 3.5–5.2)
Sodium: 141 mmol/L (ref 134–144)
Total Protein: 7.1 g/dL (ref 6.0–8.5)
eGFR: 65 mL/min/{1.73_m2} (ref >59)

## 2024-02-18 LAB — TSH: TSH: 6.71 u[IU]/mL — ABNORMAL HIGH (ref 0.450–4.500)

## 2024-02-18 LAB — VITAMIN D 25 HYDROXY (VIT D DEFICIENCY, FRACTURES): Vit D, 25-Hydroxy: 50.5 ng/mL (ref 30.0–100.0)

## 2024-02-18 NOTE — Progress Notes (Signed)
 Ethelee, your metabolic panel overall looks good.  Your hemoglobin was normal no anemia.  In fact it looked better than it did last year.  Your white blood cell count was up just slightly I know you had mentioned that before so it is right around 11.  So I think it stable I do not have a great explanation for why it is up a little bit.  Your thyroid  level is a little high at 6.7.  We will call the lab and add a free T4 since you are not currently on any thyroid  medication.  When your TSH is high this can be subclinical hypothyroidism or full hypothyroidism.  Vitamin D  looks fantastic.  Please call lab and see if we can add a free T4.

## 2024-02-20 LAB — SPECIMEN STATUS REPORT

## 2024-02-20 LAB — T4, FREE: Free T4: 1.16 ng/dL (ref 0.82–1.77)

## 2024-02-21 NOTE — Progress Notes (Signed)
 Hi Rhonda Scott, we had the lab add on a free T4 since your TSH was slightly elevated.  It still in that subclinical hypothyroid range so no need to make any changes.  But we will keep an eye on it and plan to recheck again in 6 months.

## 2024-03-02 LAB — HYPERCOAGULABLE PANEL, COMPREHENSIVE
APTT: 28.1 s
AT III Act/Nor PPP Chro: 178 % — ABNORMAL HIGH
Act. Prt C Resist w/FV Defic.: 1.7 ratio — ABNORMAL LOW
Anticardiolipin Ab, IgG: <10 [GPL'U]
Anticardiolipin Ab, IgM: <10 [MPL'U]
Beta-2 Glycoprotein I, IgA: <10 SAU
Beta-2 Glycoprotein I, IgG: <10 SGU
Beta-2 Glycoprotein I, IgM: <10 SMU
DRVVT Confirm Seconds: 70.6 s
DRVVT Ratio: 1.2 ratio
DRVVT Screen Seconds: 88.7 s — ABNORMAL HIGH
Factor VII Antigen**: 149 %
Factor VIII Activity: 200 % — ABNORMAL HIGH
Hexagonal Phospholipid Neutral: 6 s
Prot C Ag Act/Nor PPP Imm: 112 %
Prot S Ag Act/Nor PPP Imm: 112 %
Protein C Ag/FVII Ag Ratio**: 0.8 ratio
Protein S Ag/FVII Ag Ratio**: 0.8 ratio

## 2024-03-02 LAB — FACTOR V LEIDEN (PCR W/GEL ELECTROPHORESIS)

## 2024-03-03 ENCOUNTER — Other Ambulatory Visit: Payer: Self-pay | Admitting: Family Medicine

## 2024-03-03 ENCOUNTER — Ambulatory Visit: Payer: Self-pay | Admitting: Family Medicine

## 2024-03-03 DIAGNOSIS — D6851 Activated protein C resistance: Secondary | ICD-10-CM

## 2024-03-03 NOTE — Progress Notes (Signed)
 Rhonda Scott, we got the factor V Leiden PCR back.  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  Because of your recent blood clot history I would like to get you in with one of our blood specialist called a hematologist to better determine whether or not they feel like it is safe for you to come off of blood thinner.  If you are okay with that please let me know we have a great hematologist at her [facility] that I can connect you with.

## 2024-03-04 DIAGNOSIS — D6851 Activated protein C resistance: Secondary | ICD-10-CM | POA: Insufficient documentation

## 2024-03-04 NOTE — Telephone Encounter (Signed)
 Orders Placed This Encounter  Procedures   Ambulatory referral to Hematology / Oncology    Referral Priority:   Routine    Referral Type:   Consultation    Referral Reason:   Specialty Services Required    Requested Specialty:   Oncology    Number of Visits Requested:   1   Will refer here in [place]

## 2024-03-18 DIAGNOSIS — E559 Vitamin D deficiency, unspecified: Secondary | ICD-10-CM | POA: Diagnosis not present

## 2024-03-18 DIAGNOSIS — D6851 Activated protein C resistance: Secondary | ICD-10-CM | POA: Diagnosis not present

## 2024-03-18 DIAGNOSIS — E038 Other specified hypothyroidism: Secondary | ICD-10-CM | POA: Diagnosis not present

## 2024-03-18 DIAGNOSIS — I82512 Chronic embolism and thrombosis of left femoral vein: Secondary | ICD-10-CM | POA: Diagnosis not present

## 2024-03-18 DIAGNOSIS — N6311 Unspecified lump in the right breast, upper outer quadrant: Secondary | ICD-10-CM | POA: Diagnosis not present

## 2024-03-19 DIAGNOSIS — S22030A Wedge compression fracture of third thoracic vertebra, initial encounter for closed fracture: Secondary | ICD-10-CM | POA: Diagnosis not present

## 2024-04-03 ENCOUNTER — Other Ambulatory Visit: Payer: Self-pay | Admitting: Family Medicine

## 2024-04-07 DIAGNOSIS — H353132 Nonexudative age-related macular degeneration, bilateral, intermediate dry stage: Secondary | ICD-10-CM | POA: Diagnosis not present

## 2024-04-07 DIAGNOSIS — H524 Presbyopia: Secondary | ICD-10-CM | POA: Diagnosis not present

## 2024-04-08 DIAGNOSIS — N6311 Unspecified lump in the right breast, upper outer quadrant: Secondary | ICD-10-CM | POA: Diagnosis not present

## 2024-04-08 DIAGNOSIS — R92323 Mammographic fibroglandular density, bilateral breasts: Secondary | ICD-10-CM | POA: Diagnosis not present

## 2024-04-08 DIAGNOSIS — N6489 Other specified disorders of breast: Secondary | ICD-10-CM | POA: Diagnosis not present

## 2024-04-15 DIAGNOSIS — N6311 Unspecified lump in the right breast, upper outer quadrant: Secondary | ICD-10-CM | POA: Diagnosis not present

## 2024-04-15 DIAGNOSIS — R92321 Mammographic fibroglandular density, right breast: Secondary | ICD-10-CM | POA: Diagnosis not present

## 2024-04-15 DIAGNOSIS — Z17411 Hormone receptor positive with human epidermal growth factor receptor 2 negative status: Secondary | ICD-10-CM | POA: Diagnosis not present

## 2024-04-15 DIAGNOSIS — C50411 Malignant neoplasm of upper-outer quadrant of right female breast: Secondary | ICD-10-CM | POA: Diagnosis not present

## 2024-04-20 ENCOUNTER — Telehealth: Payer: Self-pay

## 2024-04-20 NOTE — Telephone Encounter (Signed)
 Prolia  VOB initiated via [email or web address]  Next Prolia  inj DUE: 05/19/24

## 2024-04-21 ENCOUNTER — Other Ambulatory Visit (HOSPITAL_COMMUNITY): Payer: Self-pay

## 2024-04-21 NOTE — Telephone Encounter (Signed)
 Pt ready for scheduling for PROLIA  on or after : 05/19/24  Option# 1: Buy/Bill (Office supplied medication)  Out-of-pocket cost due at time of clinic visit: $357  Number of injection/visits approved: 2  Primary: [employer] Prolia  co-insurance: 20% Admin fee co-insurance: 20%  Secondary: --- Prolia  co-insurance:  Admin fee co-insurance:   Medical Benefit Details: Date Benefits were checked: 04/21/24 Deductible: NO/ Coinsurance: 20%/ Admin Fee: 20%  Prior Auth: APPROVED PA# [id number] Expiration Date: 10/16/23-10/14/24   # of doses approved: 2 ----------------------------------------------------------------------- Option# 2- Med Obtained from pharmacy:  Pharmacy benefit: Copay $282.78 (Paid to pharmacy) Admin Fee: 20% (Pay at clinic)  Prior Auth: N/A PA# Expiration Date:   # of doses approved:   If patient wants fill through the pharmacy benefit please send prescription to: [employer], and include estimated need by date in rx notes. Pharmacy will ship medication directly to the office.  Patient NOT eligible for Prolia  Copay Card. Copay Card can make patient's cost as little as $25. Link to apply: [email or web address]://[email or web address]  ** This summary of benefits is an estimation of the patient's out-of-pocket cost. Exact cost may very based on individual plan coverage.

## 2024-04-22 DIAGNOSIS — Z170 Estrogen receptor positive status [ER+]: Secondary | ICD-10-CM | POA: Diagnosis not present

## 2024-04-22 DIAGNOSIS — I82512 Chronic embolism and thrombosis of left femoral vein: Secondary | ICD-10-CM | POA: Diagnosis not present

## 2024-04-22 DIAGNOSIS — E559 Vitamin D deficiency, unspecified: Secondary | ICD-10-CM | POA: Diagnosis not present

## 2024-04-22 DIAGNOSIS — C50411 Malignant neoplasm of upper-outer quadrant of right female breast: Secondary | ICD-10-CM | POA: Diagnosis not present

## 2024-04-22 DIAGNOSIS — E038 Other specified hypothyroidism: Secondary | ICD-10-CM | POA: Diagnosis not present

## 2024-04-22 DIAGNOSIS — D6851 Activated protein C resistance: Secondary | ICD-10-CM | POA: Diagnosis not present

## 2024-04-22 DIAGNOSIS — M8000XD Age-related osteoporosis with current pathological fracture, unspecified site, subsequent encounter for fracture with routine healing: Secondary | ICD-10-CM | POA: Diagnosis not present

## 2024-04-28 DIAGNOSIS — C50911 Malignant neoplasm of unspecified site of right female breast: Secondary | ICD-10-CM | POA: Diagnosis not present

## 2024-05-05 DIAGNOSIS — C50411 Malignant neoplasm of upper-outer quadrant of right female breast: Secondary | ICD-10-CM | POA: Diagnosis not present

## 2024-05-05 DIAGNOSIS — Z170 Estrogen receptor positive status [ER+]: Secondary | ICD-10-CM | POA: Diagnosis not present

## 2024-05-05 DIAGNOSIS — C50911 Malignant neoplasm of unspecified site of right female breast: Secondary | ICD-10-CM | POA: Diagnosis not present

## 2024-05-05 DIAGNOSIS — Z902 Acquired absence of lung [part of]: Secondary | ICD-10-CM | POA: Diagnosis not present

## 2024-05-05 DIAGNOSIS — Z7901 Long term (current) use of anticoagulants: Secondary | ICD-10-CM | POA: Diagnosis not present

## 2024-05-05 DIAGNOSIS — R918 Other nonspecific abnormal finding of lung field: Secondary | ICD-10-CM | POA: Diagnosis not present

## 2024-05-05 DIAGNOSIS — D0511 Intraductal carcinoma in situ of right breast: Secondary | ICD-10-CM | POA: Diagnosis not present

## 2024-05-05 DIAGNOSIS — Z17411 Hormone receptor positive with human epidermal growth factor receptor 2 negative status: Secondary | ICD-10-CM | POA: Diagnosis not present

## 2024-05-14 DIAGNOSIS — D6851 Activated protein C resistance: Secondary | ICD-10-CM | POA: Diagnosis not present

## 2024-05-14 DIAGNOSIS — C50411 Malignant neoplasm of upper-outer quadrant of right female breast: Secondary | ICD-10-CM | POA: Diagnosis not present

## 2024-05-14 DIAGNOSIS — E559 Vitamin D deficiency, unspecified: Secondary | ICD-10-CM | POA: Diagnosis not present

## 2024-05-14 DIAGNOSIS — Z170 Estrogen receptor positive status [ER+]: Secondary | ICD-10-CM | POA: Diagnosis not present

## 2024-05-14 DIAGNOSIS — E038 Other specified hypothyroidism: Secondary | ICD-10-CM | POA: Diagnosis not present

## 2024-05-14 DIAGNOSIS — I82512 Chronic embolism and thrombosis of left femoral vein: Secondary | ICD-10-CM | POA: Diagnosis not present

## 2024-05-19 ENCOUNTER — Ambulatory Visit

## 2024-05-19 ENCOUNTER — Ambulatory Visit: Payer: Medicare HMO | Admitting: Family Medicine

## 2024-05-26 ENCOUNTER — Other Ambulatory Visit: Payer: Self-pay | Admitting: *Deleted

## 2024-05-26 DIAGNOSIS — M810 Age-related osteoporosis without current pathological fracture: Secondary | ICD-10-CM | POA: Diagnosis not present

## 2024-05-27 ENCOUNTER — Ambulatory Visit: Payer: Self-pay | Admitting: Family Medicine

## 2024-05-27 DIAGNOSIS — E870 Hyperosmolality and hypernatremia: Secondary | ICD-10-CM

## 2024-05-27 LAB — CMP14+EGFR
ALT: 8 IU/L (ref 0–32)
AST: 15 IU/L (ref 0–40)
Albumin: 4 g/dL (ref 3.7–4.7)
Alkaline Phosphatase: 50 IU/L (ref 44–121)
BUN/Creatinine Ratio: 20 (ref 12–28)
BUN: 19 mg/dL (ref 8–27)
Bilirubin Total: 0.5 mg/dL (ref 0.0–1.2)
CO2: 18 mmol/L — ABNORMAL LOW (ref 20–29)
Calcium: 10 mg/dL (ref 8.7–10.3)
Chloride: 106 mmol/L (ref 96–106)
Creatinine, Ser: 0.97 mg/dL (ref 0.57–1.00)
Globulin, Total: 2.5 g/dL (ref 1.5–4.5)
Glucose: 88 mg/dL (ref 70–99)
Potassium: 3.6 mmol/L (ref 3.5–5.2)
Sodium: 145 mmol/L — ABNORMAL HIGH (ref 134–144)
Total Protein: 6.5 g/dL (ref 6.0–8.5)
eGFR: 58 mL/min/1.73 — ABNORMAL LOW (ref >59)

## 2024-05-27 NOTE — Progress Notes (Signed)
 Hi Margeret, your sodium level is up just a little bit which is unusual for you usually yours is closer to 140 or the low 140s.  It was only off by 1 point so nothing dangerous or worrisome but I do want to recheck this in about a month just to make sure that it goes back down to normal.  Liver function is normal.  I would encourage you to schedule your Medicare wellness exam with a telephone call with her medical wellness nurse Jon.

## 2024-05-28 DIAGNOSIS — M546 Pain in thoracic spine: Secondary | ICD-10-CM | POA: Diagnosis not present

## 2024-05-28 DIAGNOSIS — M5416 Radiculopathy, lumbar region: Secondary | ICD-10-CM | POA: Diagnosis not present

## 2024-05-28 DIAGNOSIS — M7918 Myalgia, other site: Secondary | ICD-10-CM | POA: Diagnosis not present

## 2024-06-02 ENCOUNTER — Other Ambulatory Visit: Payer: Self-pay

## 2024-06-02 ENCOUNTER — Ambulatory Visit

## 2024-06-02 DIAGNOSIS — M8000XA Age-related osteoporosis with current pathological fracture, unspecified site, initial encounter for fracture: Secondary | ICD-10-CM

## 2024-06-02 MED ORDER — DENOSUMAB 60 MG/ML ~~LOC~~ SOSY: 60.0000 mg | PREFILLED_SYRINGE | Freq: Once | SUBCUTANEOUS | Status: AC

## 2024-06-02 MED ORDER — DENOSUMAB 60 MG/ML ~~LOC~~ SOSY
60.0000 mg | PREFILLED_SYRINGE | Freq: Once | SUBCUTANEOUS | 0 refills | Status: AC
Start: 2024-06-02 — End: 2024-06-06
  Filled 2024-06-02: qty 1, 1d supply, fill #0
  Filled 2024-06-04: qty 1, 180d supply, fill #0

## 2024-06-02 NOTE — Progress Notes (Signed)
Ordered Prolia.

## 2024-06-02 NOTE — Progress Notes (Signed)
 Pharmacy Patient Advocate Encounter  Insurance verification completed.   The patient is insured through [facility]   Ran test claim for Prolia . Co-pay is $255.74.  This test claim was processed through [facility]- copay amounts may vary at other pharmacies due to pharmacy/plan contracts, or as the patient moves through the different stages of their insurance plan.

## 2024-06-03 ENCOUNTER — Ambulatory Visit

## 2024-06-04 ENCOUNTER — Other Ambulatory Visit: Payer: Self-pay

## 2024-06-04 NOTE — Progress Notes (Signed)
 Specialty Pharmacy Initial Fill Coordination Note  Rhonda Scott is a 83 y.o. female contacted today regarding initial fill of specialty medication(s) Denosumab  (PROLIA )   Patient requested Courier to Provider Office   Delivery date: 06/08/24   Verified address: [facility] at [facility]   Medication will be filled on 8/22.   Patient is aware of $255.74 copayment.

## 2024-06-05 ENCOUNTER — Other Ambulatory Visit: Payer: Self-pay

## 2024-06-08 NOTE — Telephone Encounter (Signed)
 Attempted call to patient to assist in scheduling Prolia  injection.  Is patient cleared for injection in regards to lab work?

## 2024-06-08 NOTE — Progress Notes (Signed)
 Yes ok for Prolia 

## 2024-06-09 ENCOUNTER — Ambulatory Visit (INDEPENDENT_AMBULATORY_CARE_PROVIDER_SITE_OTHER)

## 2024-06-09 VITALS — BP 111/68 | HR 65 | Resp 20 | Ht 64.0 in | Wt 138.0 lb

## 2024-06-09 DIAGNOSIS — M810 Age-related osteoporosis without current pathological fracture: Secondary | ICD-10-CM

## 2024-06-09 DIAGNOSIS — M8000XA Age-related osteoporosis with current pathological fracture, unspecified site, initial encounter for fracture: Secondary | ICD-10-CM

## 2024-06-09 MED ORDER — DENOSUMAB 60 MG/ML ~~LOC~~ SOSY: 60.0000 mg | PREFILLED_SYRINGE | SUBCUTANEOUS | Status: AC

## 2024-06-09 MED ORDER — DENOSUMAB 60 MG/ML ~~LOC~~ SOSY: 60.0000 mg | PREFILLED_SYRINGE | SUBCUTANEOUS | Status: DC

## 2024-06-09 NOTE — Addendum Note (Signed)
 Addended by: Denali Becvar L on: 06/09/2024 02:21 PM   Modules accepted: Orders

## 2024-06-09 NOTE — Progress Notes (Signed)
   Subjective:    Patient ID: Rhonda Scott, female    DOB: 1940/12/03, 83 y.o.   MRN: [id number]  HPI Patient is here for a Prolia  injection. Patient reports taking calcium and Vitamin D  daily.    Review of Systems     Objective:   Physical Exam        Assessment & Plan:   Patient tolerated injection well without complications. Patient advised to schedule next injection in 6 months.

## 2024-07-09 DIAGNOSIS — S22080A Wedge compression fracture of T11-T12 vertebra, initial encounter for closed fracture: Secondary | ICD-10-CM | POA: Diagnosis not present

## 2024-07-09 DIAGNOSIS — B0229 Other postherpetic nervous system involvement: Secondary | ICD-10-CM | POA: Diagnosis not present

## 2024-07-21 DIAGNOSIS — H527 Unspecified disorder of refraction: Secondary | ICD-10-CM | POA: Diagnosis not present

## 2024-07-21 DIAGNOSIS — Z961 Presence of intraocular lens: Secondary | ICD-10-CM | POA: Diagnosis not present

## 2024-07-21 DIAGNOSIS — H26493 Other secondary cataract, bilateral: Secondary | ICD-10-CM | POA: Diagnosis not present

## 2024-07-21 DIAGNOSIS — H40013 Open angle with borderline findings, low risk, bilateral: Secondary | ICD-10-CM | POA: Diagnosis not present

## 2024-07-21 DIAGNOSIS — H353132 Nonexudative age-related macular degeneration, bilateral, intermediate dry stage: Secondary | ICD-10-CM | POA: Diagnosis not present

## 2024-08-06 DIAGNOSIS — H43821 Vitreomacular adhesion, right eye: Secondary | ICD-10-CM | POA: Diagnosis not present

## 2024-08-06 DIAGNOSIS — H35033 Hypertensive retinopathy, bilateral: Secondary | ICD-10-CM | POA: Diagnosis not present

## 2024-08-06 DIAGNOSIS — H353231 Exudative age-related macular degeneration, bilateral, with active choroidal neovascularization: Secondary | ICD-10-CM | POA: Diagnosis not present

## 2024-08-06 DIAGNOSIS — H353132 Nonexudative age-related macular degeneration, bilateral, intermediate dry stage: Secondary | ICD-10-CM | POA: Diagnosis not present

## 2024-08-06 DIAGNOSIS — H43812 Vitreous degeneration, left eye: Secondary | ICD-10-CM | POA: Diagnosis not present

## 2024-08-13 ENCOUNTER — Telehealth: Payer: Self-pay

## 2024-08-20 DIAGNOSIS — M5416 Radiculopathy, lumbar region: Secondary | ICD-10-CM | POA: Diagnosis not present

## 2024-08-20 DIAGNOSIS — M7918 Myalgia, other site: Secondary | ICD-10-CM | POA: Diagnosis not present

## 2024-08-20 DIAGNOSIS — S22030A Wedge compression fracture of third thoracic vertebra, initial encounter for closed fracture: Secondary | ICD-10-CM | POA: Diagnosis not present

## 2024-08-26 DIAGNOSIS — I82512 Chronic embolism and thrombosis of left femoral vein: Secondary | ICD-10-CM | POA: Diagnosis not present

## 2024-08-26 DIAGNOSIS — E038 Other specified hypothyroidism: Secondary | ICD-10-CM | POA: Diagnosis not present

## 2024-08-26 DIAGNOSIS — Z9189 Other specified personal risk factors, not elsewhere classified: Secondary | ICD-10-CM | POA: Diagnosis not present

## 2024-08-26 DIAGNOSIS — M8000XD Age-related osteoporosis with current pathological fracture, unspecified site, subsequent encounter for fracture with routine healing: Secondary | ICD-10-CM | POA: Diagnosis not present

## 2024-08-26 DIAGNOSIS — E559 Vitamin D deficiency, unspecified: Secondary | ICD-10-CM | POA: Diagnosis not present

## 2024-08-26 DIAGNOSIS — D6851 Activated protein C resistance: Secondary | ICD-10-CM | POA: Diagnosis not present

## 2024-08-26 DIAGNOSIS — C50411 Malignant neoplasm of upper-outer quadrant of right female breast: Secondary | ICD-10-CM | POA: Diagnosis not present

## 2024-08-26 DIAGNOSIS — Z170 Estrogen receptor positive status [ER+]: Secondary | ICD-10-CM | POA: Diagnosis not present

## 2024-09-02 DIAGNOSIS — M85851 Other specified disorders of bone density and structure, right thigh: Secondary | ICD-10-CM | POA: Diagnosis not present

## 2024-09-02 DIAGNOSIS — M8000XD Age-related osteoporosis with current pathological fracture, unspecified site, subsequent encounter for fracture with routine healing: Secondary | ICD-10-CM | POA: Diagnosis not present

## 2024-09-14 DIAGNOSIS — H43812 Vitreous degeneration, left eye: Secondary | ICD-10-CM | POA: Diagnosis not present

## 2024-09-14 DIAGNOSIS — H43821 Vitreomacular adhesion, right eye: Secondary | ICD-10-CM | POA: Diagnosis not present

## 2024-09-14 DIAGNOSIS — H353231 Exudative age-related macular degeneration, bilateral, with active choroidal neovascularization: Secondary | ICD-10-CM | POA: Diagnosis not present

## 2024-09-14 DIAGNOSIS — H35033 Hypertensive retinopathy, bilateral: Secondary | ICD-10-CM | POA: Diagnosis not present

## 2024-09-14 DIAGNOSIS — H353132 Nonexudative age-related macular degeneration, bilateral, intermediate dry stage: Secondary | ICD-10-CM | POA: Diagnosis not present

## 2024-11-16 ENCOUNTER — Telehealth: Payer: Self-pay

## 2024-11-16 NOTE — Telephone Encounter (Signed)
 Prolia  VOB initiated via [email or web address]  Next Prolia  inj DUE: 12/10/24

## 2024-11-18 ENCOUNTER — Other Ambulatory Visit (HOSPITAL_COMMUNITY): Payer: Self-pay

## 2024-11-18 NOTE — Telephone Encounter (Signed)
" ° °  [employer] cannot give estimate, following Medicare Advantage guideline (20% coinsurance, 20% admin fee)   "

## 2024-11-18 NOTE — Telephone Encounter (Signed)
 Rhonda Scott

## 2024-11-18 NOTE — Telephone Encounter (Signed)
 Pt ready for scheduling for PROLIA  on or after : 12/10/24  Option# 1: Buy/Bill (Office supplied medication)  Out-of-pocket cost due at time of clinic visit: $377  Number of injection/visits approved: 2  Primary: [employer] Prolia  co-insurance: 20% Admin fee co-insurance: 20%  Secondary: --- Prolia  co-insurance:  Admin fee co-insurance:   Medical Benefit Details: Date Benefits were checked: 11/17/24 Deductible: NO/ Coinsurance: 20%/ Admin Fee: 20%  Prior Auth: APPROVED PA# [id number] Expiration Date: 10/15/24-10/14/25  # of doses approved: 2 ----------------------------------------------------------------------- Option# 2- Med Obtained from pharmacy:  Pharmacy benefit: Copay $907.90 (Paid to pharmacy) Admin Fee: 20% (Pay at clinic)  Prior Auth: N/A PA# Expiration Date:   # of doses approved:   If patient wants fill through the pharmacy benefit please send prescription to: [facility], and include estimated need by date in rx notes. Pharmacy will ship medication directly to the office.  Patient NOT eligible for Prolia  Copay Card. Copay Card can make patient's cost as little as $25. Link to apply: [email or web address]  ** This summary of benefits is an estimation of the patient's out-of-pocket cost. Exact cost may very based on individual plan coverage.

## 2024-12-10 ENCOUNTER — Ambulatory Visit
